# Patient Record
Sex: Male | Born: 1970 | Race: Black or African American | Hispanic: No | Marital: Single | Smoking: Never smoker
Health system: Southern US, Community
[De-identification: ages and names within clinical notes are randomized; demographics above are authoritative.]

## PROBLEM LIST (undated history)

## (undated) DIAGNOSIS — E119 Type 2 diabetes mellitus without complications: Secondary | ICD-10-CM

## (undated) DIAGNOSIS — K760 Fatty (change of) liver, not elsewhere classified: Secondary | ICD-10-CM

## (undated) DIAGNOSIS — I10 Essential (primary) hypertension: Secondary | ICD-10-CM

## (undated) DIAGNOSIS — M1990 Unspecified osteoarthritis, unspecified site: Secondary | ICD-10-CM

## (undated) HISTORY — DX: Type 2 diabetes mellitus without complications: E11.9

---

## 1998-05-29 ENCOUNTER — Emergency Department (HOSPITAL_COMMUNITY): Admission: EM | Admit: 1998-05-29 | Discharge: 1998-05-29 | Payer: Self-pay | Admitting: Emergency Medicine

## 1999-12-23 ENCOUNTER — Emergency Department (HOSPITAL_COMMUNITY): Admission: EM | Admit: 1999-12-23 | Discharge: 1999-12-23 | Payer: Self-pay | Admitting: *Deleted

## 2000-09-01 ENCOUNTER — Encounter: Payer: Self-pay | Admitting: Emergency Medicine

## 2000-09-01 ENCOUNTER — Emergency Department (HOSPITAL_COMMUNITY): Admission: EM | Admit: 2000-09-01 | Discharge: 2000-09-01 | Payer: Self-pay | Admitting: Emergency Medicine

## 2000-11-19 ENCOUNTER — Emergency Department (HOSPITAL_COMMUNITY): Admission: EM | Admit: 2000-11-19 | Discharge: 2000-11-19 | Payer: Self-pay | Admitting: Internal Medicine

## 2000-11-19 ENCOUNTER — Encounter: Payer: Self-pay | Admitting: Emergency Medicine

## 2001-04-02 ENCOUNTER — Emergency Department (HOSPITAL_COMMUNITY): Admission: EM | Admit: 2001-04-02 | Discharge: 2001-04-02 | Payer: Self-pay | Admitting: Emergency Medicine

## 2001-10-19 ENCOUNTER — Emergency Department (HOSPITAL_COMMUNITY): Admission: EM | Admit: 2001-10-19 | Discharge: 2001-10-19 | Payer: Self-pay

## 2001-10-23 ENCOUNTER — Emergency Department (HOSPITAL_COMMUNITY): Admission: EM | Admit: 2001-10-23 | Discharge: 2001-10-24 | Payer: Self-pay | Admitting: Emergency Medicine

## 2001-10-24 ENCOUNTER — Encounter: Payer: Self-pay | Admitting: Emergency Medicine

## 2001-12-12 ENCOUNTER — Emergency Department (HOSPITAL_COMMUNITY): Admission: EM | Admit: 2001-12-12 | Discharge: 2001-12-12 | Payer: Self-pay | Admitting: *Deleted

## 2001-12-12 ENCOUNTER — Encounter: Payer: Self-pay | Admitting: Internal Medicine

## 2001-12-12 ENCOUNTER — Ambulatory Visit (HOSPITAL_COMMUNITY): Admission: RE | Admit: 2001-12-12 | Discharge: 2001-12-12 | Payer: Self-pay | Admitting: Internal Medicine

## 2001-12-12 ENCOUNTER — Encounter: Admission: RE | Admit: 2001-12-12 | Discharge: 2001-12-12 | Payer: Self-pay | Admitting: Internal Medicine

## 2002-02-28 ENCOUNTER — Encounter: Admission: RE | Admit: 2002-02-28 | Discharge: 2002-02-28 | Payer: Self-pay | Admitting: Internal Medicine

## 2002-03-13 ENCOUNTER — Emergency Department (HOSPITAL_COMMUNITY): Admission: EM | Admit: 2002-03-13 | Discharge: 2002-03-14 | Payer: Self-pay | Admitting: Emergency Medicine

## 2002-03-14 ENCOUNTER — Encounter: Payer: Self-pay | Admitting: Emergency Medicine

## 2002-10-26 ENCOUNTER — Encounter: Payer: Self-pay | Admitting: Family Medicine

## 2002-10-26 ENCOUNTER — Encounter: Admission: RE | Admit: 2002-10-26 | Discharge: 2002-10-26 | Payer: Self-pay | Admitting: Family Medicine

## 2003-03-26 ENCOUNTER — Emergency Department (HOSPITAL_COMMUNITY): Admission: EM | Admit: 2003-03-26 | Discharge: 2003-03-26 | Payer: Self-pay | Admitting: Emergency Medicine

## 2003-05-14 ENCOUNTER — Emergency Department (HOSPITAL_COMMUNITY): Admission: EM | Admit: 2003-05-14 | Discharge: 2003-05-15 | Payer: Self-pay | Admitting: Emergency Medicine

## 2004-03-18 ENCOUNTER — Emergency Department (HOSPITAL_COMMUNITY): Admission: EM | Admit: 2004-03-18 | Discharge: 2004-03-18 | Payer: Self-pay | Admitting: Emergency Medicine

## 2004-05-01 ENCOUNTER — Ambulatory Visit (HOSPITAL_COMMUNITY): Admission: RE | Admit: 2004-05-01 | Discharge: 2004-05-01 | Payer: Self-pay | Admitting: Family Medicine

## 2004-05-01 ENCOUNTER — Encounter: Admission: RE | Admit: 2004-05-01 | Discharge: 2004-05-01 | Payer: Self-pay | Admitting: Family Medicine

## 2004-05-11 ENCOUNTER — Ambulatory Visit (HOSPITAL_BASED_OUTPATIENT_CLINIC_OR_DEPARTMENT_OTHER): Admission: RE | Admit: 2004-05-11 | Discharge: 2004-05-11 | Payer: Self-pay | Admitting: Sports Medicine

## 2004-05-12 ENCOUNTER — Encounter: Admission: RE | Admit: 2004-05-12 | Discharge: 2004-05-12 | Payer: Self-pay | Admitting: Family Medicine

## 2005-03-02 ENCOUNTER — Emergency Department (HOSPITAL_COMMUNITY): Admission: EM | Admit: 2005-03-02 | Discharge: 2005-03-02 | Payer: Self-pay | Admitting: Emergency Medicine

## 2005-04-14 ENCOUNTER — Emergency Department (HOSPITAL_COMMUNITY): Admission: EM | Admit: 2005-04-14 | Discharge: 2005-04-14 | Payer: Self-pay | Admitting: Emergency Medicine

## 2006-04-28 ENCOUNTER — Encounter: Admission: RE | Admit: 2006-04-28 | Discharge: 2006-04-28 | Payer: Self-pay | Admitting: Gastroenterology

## 2006-11-11 DIAGNOSIS — I10 Essential (primary) hypertension: Secondary | ICD-10-CM | POA: Insufficient documentation

## 2006-11-11 DIAGNOSIS — G4730 Sleep apnea, unspecified: Secondary | ICD-10-CM

## 2006-11-11 DIAGNOSIS — G4733 Obstructive sleep apnea (adult) (pediatric): Secondary | ICD-10-CM | POA: Insufficient documentation

## 2007-02-17 ENCOUNTER — Emergency Department (HOSPITAL_COMMUNITY): Admission: EM | Admit: 2007-02-17 | Discharge: 2007-02-17 | Payer: Self-pay | Admitting: Emergency Medicine

## 2007-04-28 ENCOUNTER — Emergency Department (HOSPITAL_COMMUNITY): Admission: EM | Admit: 2007-04-28 | Discharge: 2007-04-28 | Payer: Self-pay | Admitting: Emergency Medicine

## 2008-04-12 ENCOUNTER — Inpatient Hospital Stay (HOSPITAL_COMMUNITY): Admission: EM | Admit: 2008-04-12 | Discharge: 2008-04-14 | Payer: Self-pay | Admitting: Emergency Medicine

## 2008-04-12 ENCOUNTER — Ambulatory Visit: Payer: Self-pay | Admitting: Internal Medicine

## 2008-04-12 ENCOUNTER — Ambulatory Visit: Payer: Self-pay | Admitting: Cardiovascular Disease

## 2008-04-13 ENCOUNTER — Encounter (INDEPENDENT_AMBULATORY_CARE_PROVIDER_SITE_OTHER): Payer: Self-pay | Admitting: Internal Medicine

## 2008-04-13 ENCOUNTER — Ambulatory Visit: Payer: Self-pay | Admitting: Vascular Surgery

## 2008-12-17 ENCOUNTER — Emergency Department (HOSPITAL_COMMUNITY): Admission: EM | Admit: 2008-12-17 | Discharge: 2008-12-17 | Payer: Self-pay | Admitting: Emergency Medicine

## 2010-10-05 ENCOUNTER — Encounter: Payer: Self-pay | Admitting: Emergency Medicine

## 2010-10-05 ENCOUNTER — Encounter: Payer: Self-pay | Admitting: Family Medicine

## 2010-10-05 ENCOUNTER — Encounter: Payer: Self-pay | Admitting: Gastroenterology

## 2011-01-30 NOTE — Discharge Summary (Signed)
NAME:  DESI, ROWE NO.:  [id number]   MEDICAL RECORD NO.:  [id number]          PATIENT TYPE:  INP   LOCATION:  4710                         FACILITY:  [facility]   PHYSICIAN:  Ileana Roup, M.D.  DATE OF BIRTH:  October 21, 1970   DATE OF ADMISSION:  04/12/2008  DATE OF DISCHARGE:  04/14/2008                               DISCHARGE SUMMARY   DISCHARGE DIAGNOSES:  1. Chest pain, etiology unclear  2. Abdominal pain.  3. Alcohol abuse.  4. Leukocytosis.  5. Hypertension.  6. Obstructive sleep apnea.  7. Increased LFTs.   DISCHARGE MEDICATIONS:  1. Amlodipine 5 mg p.o. daily.  2. Benazepril-HCTZ 20-25 mg p.o. daily.  3. Protonix 40 mg p.o. daily.   DISPOSITION AND FOLLOWUP:  The patient is to call [facility] to schedule followup appointment on Monday after discharge.  At  this time, the patient's abdominal/chest pain should be reassessed.  The  patient's blood pressure should also be noted since his medications were  started at the time of discharge.  The patient should cut back on his  alcohol, which could be contributing to his abdominal pain.   PROCEDURES PERFORMED:  1. The patient had a chest x-ray on April 12, 2008.  Impression:  No      acute process.  2. The patient had a CT of the abdomen and pelvis done on April 13, 2008.  CT of the abdomen, impression:  Suspect small gallstones.      Gallbladder slightly distended.  If there is high clinical      suspicion for significant gallbladder disease, ultrasound may be      helpful.  Diffuse fatty infiltration of the liver.  A 12-mm      nonspecific left adrenal nodule; in the absence of cancer history,      this statistically is most likely a small adenoma.  Left basilar      atelectasis.  Scattered left-sided colonic diverticula.  3. CT of pelvis, impression:  No acute findings in the pelvis.  4. The patient also had an abdominal ultrasound done on April 13, 2008.      Impression:  a.     Limited visualization of some structures due to body       habitus.      b.     Fatty, enlarged liver.  Splenomegaly.  5. The patient had a 2-D echo done on April 13, 2008.  Summary, overall      left ventricular systolic function was normal.  Left ventricle      ejection fraction was estimated to be 60%.  There was no left      ventricle regional wall motion abnormalities.  Left ventricular      wall thickness was mildly increased.   There were no consultations made.   Brief H&P is as follows:   CHIEF COMPLAINT:  Chest pain, abdominal pain, nausea, and vomiting.   PRIMARY CARE PHYSICIAN:  [facility].   HISTORY OF PRESENT ILLNESS:  The patient is a 40 year old male with  past  medical history of obstructive sleep apnea, no CPAP, hypertension,  presenting with 3 days of chest pain.  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  This caused him to throw up x3, after  which he felt tired and fatigued.  This occurred in the evening and  after sitting for a while the pain improved, but remained present.  The  pain is sharp, better with rest, and worse with exertion.  Over the last  3 days, he has had postprandial emesis x2-3 with every meal.  He also  has had subjective fever and chills, no fever while here, had a high  temperature per his clinic.  The pain is worse with eating, but vomiting  does not help.  He has had shortness of breath with exertion, sleep  apnea, orthopnea, and a chronic cough for years.  He denies diarrhea,  muscle aches, melena, hematochezia, difficulty swallowing, and sick  contacts.   For allergies, past medical history, past surgical history, home  medications, substance history, social history, and family history,  please see hospital chart.   REVIEW OF SYSTEMS:  As seen in HPI.   PHYSICAL EXAMINATION:  VITAL SIGNS:  Temperature equals to 97.5;  blood  pressure 87/45, after morphine which quickly normalized; pulse 107;  respiratory rate 18; and O2 sat is 91% on room air.  GENERAL:  Obese, but muscular in no acute distress.  Eyes, anicteric and  PERLA.  ENT, good dentition.  NECK:  Obese.  No JVD.  No carotid bruits.  No thyromegaly.  RESPIRATORY:  CTA bilaterally.  Good air movement.  CV:  S1 and S2 distant, no murmurs, mildly tachycardic.  GI:  Obese and nontender.  No murmurs, rubs, or gallops, and unable to  appreciate any hepatomegaly.  EXTREMITIES:  Large calf muscles with anterior right shin tenderness and  loss of hair bilaterally in the lower legs at the shins.  GU:  No CVA tenderness.  SKIN:  Small keloid scars with multiple moles on his neck.  MUSCULOSKELETAL:  Normal.  NEURO:  Alert and orient x3, nonfocal with good strength in all 4  extremities.  Some numbness in the right and left feet, chronic.  PSYCH:  Alert, pleasant, normal affect.   INITIAL LABORATORY DATA:  Sodium 134, potassium 3.8, chloride 100,  bicarb 25, anion gap of 9, BUN 24, creatinine 1.23, glucose 149,  bilirubin 1.2, AST 51, ALT 58, protein 7.3. albumin 3.7, and calcium  9.2.  WBC 11.5, hemoglobin 16, platelets 177, ANC 8.5, and MCV 98.8.  Chest x-ray was negative.  Urinalysis was negative.  The patient was  FOBT negative.  EKG showed question of left posterior hemiblock.  PTT  26, PT 14.1, and INR 1.1.  BNP less than 30.  D-dimer less than 0.22.  UDS was positive for opiates which he received morphine in the ED prior  to the test.  EtOH less than 5.  Cardiac enzymes, CK-MB 6.5 and troponin  less than 0.05.   HOSPITAL COURSE:  1. Chest pain, etiology unclear, but ACS unlikely given negative      cardiac enzymes and EKG and the nature of the symptoms.  The      patient's pain is likely GI in nature, possibly secondary to      biliary colic versus gastroenteritis versus PUD versus GERD.  As      mentioned above, the patient  was ruled out for  ACS.  He also      received CT of the abdomen and pelvis with above findings as well      as an abdominal ultrasound.  His pain had resolved by the time of      discharge.  He will be discharged on Protonix 40 daily and has been      told to avoid NSAIDS and to cut back on his alcohol.  The patient      was also ruled out for PE and pneumonia with chest x-ray and a      negative D-dimer.  2. Abdominal pain.  Please see above.  The patient's abdominal      ultrasound did show diffuse fatty infiltration and splenomegaly,      likely secondary to the patient's chronic alcohol abuse.  There is      no evidence of gallbladder pathology on the abdominal ultrasound.      The patient will be discharged on PPI, and I have advised him to      cut back on his alcohol and eventually quit due to progression of      liver disease.  The patient expressed understanding and said he      would cut back.  3. Alcohol abuse.  Please see #2 above.  4. Leukocytosis.  The patient's mildly increased WBC had resolved at      the time of discharge.  5. Hypertension.  Initially, the patient was put on metoprolol alone      secondary to the initial thought of ACS.  His home medications of      benazepril HCTZ and Norvasc were initially held secondary to the      patient's blood pressure dropping in the mid 80s after he received      morphine.  These were resumed prior to discharge and the patient's      metoprolol was discontinued as the ACS was very unlikely in this      case.  The patient's ACE inhibitor was also initially held      secondary to the patient's creatinine increasing, which quickly      normalized and as a result this was restarted.  6. Obstructive sleep apnea.  The patient has a history of severe      obstructive sleep apnea in the past, but is not on CPAP at home.      We have arranged for case manager to help arrange for home CPAP and      as a result he will need sleep study, so CPAP titration  can be      undertaken.  This will be done in the outpatient setting.  7. Increased LFTs.  The patient had a negative hepatitis panel and      abdominal findings showed diffuse fatty liver.  This is likely      secondary to the patient's long history of alcohol abuse.  I have      asked him to cut back on his alcohol and eventually quit as      mentioned above.   SIGNIFICANT LABORATORY DATA DURING HOSPITAL STAY:  Include PT 14.1, INR  1.1, and PTT 26.  D-dimer less than 0.22.  BNP less than 30.  Lipase 22.  TSH 1.518.  Hemoglobin A1c 6.1, cholesterol 153, triglycerides 74, HDL  28, and LDL 110.  Magnesium 1.8.  Hepatitis B surface antigen negative,  hepatitis B core antibody negative, hepatitis A antibody  negative,  hepatitis C antibody negative.   DISCHARGE LABORATORY DATA:  Sodium 133, potassium 3.6, chloride 94,  bicarb 27, glucose 108, BUN 9, creatinine 1.0, calcium 9.2, total  bilirubin 1.7, direct bilirubin 0.3, indirect bilirubin 1.4, alkaline  phosphatase 54, AST 41, ALT 48, total protein 7.1, and albumin 3.5.  WBC  8.3, hemoglobin 15.6, hematocrit 44.0, MCV 96.7, and platelets 158.   DISCHARGE VITAL SIGNS:  Temperature 97.5, pulse 88, respiratory rate 20,  blood pressure 155/109, and O2 sats 98% on room air.      Rufina Falco, M.D.  Electronically Signed      Ileana Roup, M.D.  Electronically Signed    JY/MEDQ  D:  04/20/2008  T:  04/20/2008  Job:  [id number]   cc:   Olena Leatherwood [facility]

## 2011-01-30 NOTE — Procedures (Signed)
NAME:  Ernest Myers, Ernest Myers NO.:  [id number]   MEDICAL RECORD NO.:  [id number]          PATIENT TYPE:  OUT   LOCATION:  SLEEP CENTER                 FACILITY:  [facility]   PHYSICIAN:  Clinton D. Maple Hudson, M.D. DATE OF BIRTH:  08-04-1971   DATE OF ADMISSION:  05/11/2004  DATE OF DISCHARGE:  05/11/2004                              NOCTURNAL POLYSOMNOGRAM   REFERRING PHYSICIAN:  Royal Hawthorn B. Darrick Penna, M.D.   INDICATION FOR STUDY AND HISTORY:  Hypersomnia with sleep apnea.   Epworth sleepiness score 18/24, neck size 18.5 inches, BMI 49, weight not  specified.   MEDICATIONS:  None listed.   SLEEP ARCHITECTURE:  Total sleep time 383 minutes with sleep efficiency of  81%.  Stage I was 11%; stage II, 57%; Stages III and IV 12%.  REM was 20% of  total sleep time.  Latency to sleep onset 30 minutes, latency to REM 122  minutes.  Awake after sleep onset 59 minute.  Arousal index 59 per hour.   RESPIRATORY DATA:  Split study protocol.  RDI 119 per hour before CPAP  consistent with very severe obstructive sleep apnea/hypopnea syndrome.  This  included 229 obstructive apneas and 46 hypopneas before CPAP.  He slept  mostly on his back.  REM RDI was 54 per hour.  CPAP was titrated to 17 CWP.  Best control was achieved at 15 CWP, RDI 7 per hour.  A large Respironics  soft gel face mask was used with heated humidifier.   OXYGEN DATA:  Very loud snoring before CPAP with moderate oxygen  desaturation to a nadir of 71% .  Mean saturation was 92 to 94% through the  study with normal oxygenation on CPAP.   CARDIAC DATA:  Normal sinus rhythm with occasional PVCs.   MOVEMENT AND PARASOMNIA:  Occasional leg jerks with insignificant effect on  sleep.   IMPRESSION AND RECOMMENDATIONS:  Very severe obstructive sleep  apnea/hypopnea syndrome, RDI 119 per hour.  Fairly good control on CPAP at  15 CWP, RDI 7 per hour, using a large Respironics soft gel mask with heated  humidifier.   Clinton D. Maple Hudson,  M.D.  Diplomate, Biomedical engineer of Sleep Medicine                                   ______________________________                                Rennis Chris. Maple Hudson, M.D.                                Diplomate, American Board of Sleep Medicine    CDY/MEDQ  D:  05/18/2004 12:37:01  T:  05/19/2004 15:15:16  Job:  [id number]

## 2011-03-22 ENCOUNTER — Emergency Department (HOSPITAL_COMMUNITY)
Admission: EM | Admit: 2011-03-22 | Discharge: 2011-03-22 | Disposition: A | Discharge status: Home / Self Care | Payer: PRIVATE HEALTH INSURANCE | Attending: Emergency Medicine | Admitting: Emergency Medicine

## 2011-03-22 DIAGNOSIS — Y998 Other external cause status: Secondary | ICD-10-CM | POA: Insufficient documentation

## 2011-03-22 DIAGNOSIS — Y92009 Unspecified place in unspecified non-institutional (private) residence as the place of occurrence of the external cause: Secondary | ICD-10-CM | POA: Insufficient documentation

## 2011-03-22 DIAGNOSIS — IMO0002 Reserved for concepts with insufficient information to code with codable children: Secondary | ICD-10-CM | POA: Insufficient documentation

## 2011-03-22 DIAGNOSIS — X58XXXA Exposure to other specified factors, initial encounter: Secondary | ICD-10-CM | POA: Insufficient documentation

## 2011-03-22 DIAGNOSIS — I10 Essential (primary) hypertension: Secondary | ICD-10-CM | POA: Insufficient documentation

## 2011-06-12 LAB — COMPREHENSIVE METABOLIC PANEL
ALT: 56 — ABNORMAL HIGH
AST: 53 — ABNORMAL HIGH
Albumin: 3.7
Alkaline Phosphatase: 55
BUN: 24 — ABNORMAL HIGH
CO2: 25
CO2: 28
Chloride: 93 — ABNORMAL LOW
Creatinine, Ser: 1.23
GFR calc Af Amer: >60
GFR calc non Af Amer: >60
Glucose, Bld: 149 — ABNORMAL HIGH
Potassium: 3.8
Potassium: 3.8
Sodium: 132 — ABNORMAL LOW
Sodium: 134 — ABNORMAL LOW
Total Bilirubin: 2 — ABNORMAL HIGH
Total Protein: 7.3

## 2011-06-12 LAB — BASIC METABOLIC PANEL
BUN: 9
CO2: 27
Calcium: 9.2
Creatinine, Ser: 1
GFR calc Af Amer: >60

## 2011-06-12 LAB — CARDIAC PANEL(CRET KIN+CKTOT+MB+TROPI)
CK, MB: 6.2 — ABNORMAL HIGH
CK, MB: 7.2 — ABNORMAL HIGH
Total CK: 513 — ABNORMAL HIGH
Total CK: 611 — ABNORMAL HIGH
Troponin I: 0.01
Troponin I: <0.01

## 2011-06-12 LAB — LIPID PANEL
HDL: 28 — ABNORMAL LOW
VLDL: 15

## 2011-06-12 LAB — DIFFERENTIAL
Basophils Absolute: 0.1
Basophils Absolute: 0.1
Basophils Absolute: 0.1
Basophils Relative: 1
Basophils Relative: 1
Eosinophils Absolute: 0.1
Eosinophils Absolute: 0.2
Eosinophils Absolute: 0.2
Eosinophils Relative: 2
Lymphs Abs: 2.9
Monocytes Relative: 10
Neutro Abs: 8.5 — ABNORMAL HIGH
Neutro Abs: 5
Neutrophils Relative %: 60

## 2011-06-12 LAB — CBC
Hemoglobin: 16.1
MCHC: 33.8
MCHC: 35.4
MCV: 98.8
Platelets: 177
Platelets: 158
RBC: 4.8
RBC: 4.55
RDW: 12.9
WBC: 11.4 — ABNORMAL HIGH
WBC: 11.5 — ABNORMAL HIGH

## 2011-06-12 LAB — URINALYSIS, ROUTINE W REFLEX MICROSCOPIC
Bilirubin Urine: NEGATIVE
Glucose, UA: NEGATIVE
Hgb urine dipstick: NEGATIVE
Ketones, ur: NEGATIVE

## 2011-06-12 LAB — HEPATITIS PANEL, ACUTE
HCV Ab: NEGATIVE
Hepatitis B Surface Ag: NEGATIVE

## 2011-06-12 LAB — HEPATIC FUNCTION PANEL
AST: 41 — ABNORMAL HIGH
Albumin: 3.5
Total Protein: 7.1

## 2011-06-12 LAB — POCT CARDIAC MARKERS: Troponin i, poc: <0.05

## 2011-06-12 LAB — RAPID URINE DRUG SCREEN, HOSP PERFORMED
Amphetamines: NOT DETECTED
Barbiturates: NOT DETECTED
Opiates: POSITIVE — AB
Tetrahydrocannabinol: NOT DETECTED

## 2011-06-12 LAB — PROTIME-INR: Prothrombin Time: 14.1

## 2011-06-12 LAB — MAGNESIUM: Magnesium: 1.8

## 2011-06-12 LAB — APTT: aPTT: 26

## 2011-06-12 LAB — HEMOGLOBIN A1C: Mean Plasma Glucose: 140

## 2011-06-12 LAB — ETHANOL: Alcohol, Ethyl (B): <5

## 2011-07-02 LAB — CBC
HCT: 47.3
Hemoglobin: 16.3
MCHC: 34.5
MCV: 95.8
Platelets: 192
RBC: 4.93
RDW: 13.5
WBC: 10.2

## 2011-07-02 LAB — COMPREHENSIVE METABOLIC PANEL
ALT: 91 — ABNORMAL HIGH
AST: 65 — ABNORMAL HIGH
CO2: 24
Calcium: 10.1
Chloride: 103
Creatinine, Ser: 1.14
GFR calc non Af Amer: >60
Glucose, Bld: 119 — ABNORMAL HIGH
Total Bilirubin: 1.2

## 2011-07-02 LAB — DIFFERENTIAL
Basophils Absolute: 0.1
Basophils Relative: 1
Eosinophils Absolute: 0.2
Eosinophils Relative: 2
Lymphocytes Relative: 19
Lymphs Abs: 1.9
Monocytes Absolute: 0.9 — ABNORMAL HIGH
Monocytes Relative: 9
Neutro Abs: 7.1
Neutrophils Relative %: 70

## 2011-07-02 LAB — PROTIME-INR
INR: 1
Prothrombin Time: 13.7

## 2011-07-02 LAB — APTT: aPTT: 29

## 2012-07-04 ENCOUNTER — Ambulatory Visit
Admission: RE | Admit: 2012-07-04 | Discharge: 2012-07-04 | Disposition: A | Discharge status: Home / Self Care | Payer: PRIVATE HEALTH INSURANCE | Source: Ambulatory Visit | Attending: Family Medicine | Admitting: Family Medicine

## 2012-07-04 ENCOUNTER — Other Ambulatory Visit: Payer: Self-pay | Admitting: Family Medicine

## 2012-07-04 DIAGNOSIS — R52 Pain, unspecified: Secondary | ICD-10-CM

## 2013-01-30 ENCOUNTER — Ambulatory Visit (INDEPENDENT_AMBULATORY_CARE_PROVIDER_SITE_OTHER): Payer: PRIVATE HEALTH INSURANCE | Admitting: Physician Assistant

## 2013-01-30 ENCOUNTER — Encounter: Payer: Self-pay | Admitting: Physician Assistant

## 2013-01-30 VITALS — BP 146/86 | HR 80 | Temp 98.0°F | Resp 18 | Ht 69.5 in | Wt 361.0 lb

## 2013-01-30 DIAGNOSIS — M222X1 Patellofemoral disorders, right knee: Secondary | ICD-10-CM

## 2013-01-30 DIAGNOSIS — E669 Obesity, unspecified: Secondary | ICD-10-CM

## 2013-01-30 DIAGNOSIS — M25569 Pain in unspecified knee: Secondary | ICD-10-CM

## 2013-01-30 MED ORDER — NAPROXEN 500 MG PO TABS: 500.0000 mg | ORAL_TABLET | Freq: Two times a day (BID) | ORAL | Status: DC

## 2013-01-30 NOTE — Progress Notes (Signed)
   Patient ID: Ernest Myers MRN: [id number], DOB: 04-Mar-1971, 42 y.o. Date of Encounter: 01/30/2013, 8:45 AM    Chief Complaint:  Chief Complaint  Patient presents with  . Knee Pain    right       HPI: 42 y.o. year old male has c/o right knee pain. Says he has no prior h/o knee surgery or any evaluation of right knee pain. He reports that right knee has hurt every day for past two years. Hurts all the time. Says he "walks with a limp'. He works on a [employer]. 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 Never goes up and down steps to know how this affects his knee. States that it never locks up or gives way. States that hte pain is at the front of his knee/ anterior aspect.  He had no trauma or injury at time of symptom onset.   Home Meds: See attached medication section for any medications that were entered at today's visit. The computer does not put those onto this list.The following list is a list of meds entered prior to today's visit.   No current outpatient prescriptions on file prior to visit.   No current facility-administered medications on file prior to visit.    Allergies: No Known Allergies    Review of Systems: See HPI for pertinent ROS. All other ROS negative.    Physical Exam: Blood pressure 146/86, pulse 80, temperature 98 F (36.7 C), temperature source Oral, resp. rate 18, height 5' 9.5" (1.765 m), weight 361 lb (163.749 kg)., Body mass index is 52.56 kg/(m^2). General: Severely obese AAM. Appears in no acute distress. Lungs: Clear bilaterally to auscultation without wheezes, rales, or rhonchi. Breathing is unlabored. Heart: Regular rhythm. No murmurs, rubs, or gallops. Msk:  Right Knee: Inspection is normal except for obesity. No effusion, swelling, ecchymosis. Palpation reveals tenderness with palpation of anterior knee at patella. Less tenderness at medial joint line. Negative grind test. Negative anterior drawer. No  laxity with valgus/varus.  Skin: Warm and dry. Neuro: Alert and oriented X 3. Moves all extremities spontaneously. Gait is normal. CNII-XII grossly in tact. Psych:  Responds to questions appropriately with a normal affect.     ASSESSMENT AND PLAN:  42 y.o. year old male with  1. Patellofemoral syndrome, right - naproxen (NAPROSYN) 500 MG tablet; Take 1 tablet (500 mg total) by mouth 2 (two) times daily with a meal.  Dispense: 60 tablet; Refill: 0  2. OBESITY, NOS 3. Osteo Arthritis of knee.  Discussed with him that a lot of this is probably secondary to his weight. All of his weight is bearing down on these two little knee joints. We have discussed diet, exercise, weight loss multiple times in past. Discussed it again today.  I demonstrated exercises for him to do to strengthen quadriceps and muscles that support the knee. He is to do these at least BID on routine basis.  He is to take Naprosyn with food. If no improvement, f/u and will get XRay.    Signed, [address] [place], [place], [facility] 01/30/2013 8:45 AM

## 2013-02-06 ENCOUNTER — Other Ambulatory Visit: Payer: Self-pay | Admitting: Family Medicine

## 2013-02-25 ENCOUNTER — Other Ambulatory Visit: Payer: Self-pay | Admitting: Family Medicine

## 2013-03-20 ENCOUNTER — Ambulatory Visit (INDEPENDENT_AMBULATORY_CARE_PROVIDER_SITE_OTHER): Payer: PRIVATE HEALTH INSURANCE | Admitting: Physician Assistant

## 2013-03-20 ENCOUNTER — Encounter: Payer: Self-pay | Admitting: Physician Assistant

## 2013-03-20 VITALS — BP 154/100 | HR 84 | Temp 98.4°F | Resp 20 | Ht 69.0 in | Wt 361.0 lb

## 2013-03-20 DIAGNOSIS — E669 Obesity, unspecified: Secondary | ICD-10-CM

## 2013-03-20 DIAGNOSIS — M25569 Pain in unspecified knee: Secondary | ICD-10-CM

## 2013-03-20 DIAGNOSIS — M79671 Pain in right foot: Secondary | ICD-10-CM

## 2013-03-20 DIAGNOSIS — M222X1 Patellofemoral disorders, right knee: Secondary | ICD-10-CM

## 2013-03-20 DIAGNOSIS — M79609 Pain in unspecified limb: Secondary | ICD-10-CM

## 2013-03-20 NOTE — Progress Notes (Signed)
   Patient ID: Ernest Myers MRN: [id number], DOB: [birth year], 42 y.o. Date of Encounter: 03/20/2013, 12:52 PM    Chief Complaint:  Chief Complaint  Patient presents with  . c/o right knee and foot pain     HPI: 42 y.o. year old obese AA male is here with c/o pain in right knee and right heel/foot.   Says the pain in the right knee is the same pain that he c/o at [facility] with me 01/30/13. See that note. Says he sill has constant pain in anterior portion of the knee. Hurts worse if he stands up after sitting prlonged time. He never did the exercises I recommended at the LOV. Still, the knee never locks up or gives way.  Now he also c/o pain in the heel of his right foot. No injury/trauma.      Home Meds: See attached medication section for any medications that were entered at today's visit. The computer does not put those onto this list.The following list is a list of meds entered prior to today's visit.   Current Outpatient Prescriptions on File Prior to Visit  Medication Sig Dispense Refill  . amLODipine (NORVASC) 5 MG tablet TAKE 1 TABLET EVERY DAY  30 tablet  5  . benazepril (LOTENSIN) 20 MG tablet TAKE 1 TABLET EVERY DAY  30 tablet  5  . naproxen (NAPROSYN) 500 MG tablet Take 1 tablet (500 mg total) by mouth 2 (two) times daily with a meal.  60 tablet  0   No current facility-administered medications on file prior to visit.    Allergies: No Known Allergies    Review of Systems: See HPI for pertinent ROS. All other ROS negative.    Physical Exam: Blood pressure 154/100, pulse 84, temperature 98.4 F (36.9 C), temperature source Oral, resp. rate 20, height 5\' 9"  (1.753 m), weight 361 lb (163.749 kg)., Body mass index is 53.29 kg/(m^2). General: Morbidly obese AAM. Appears in no acute distress. Lungs: Clear bilaterally to auscultation without wheezes, rales, or rhonchi. Breathing is unlabored. Heart: Regular rhythm. No murmurs, rubs, or gallops. Msk:  Strength and tone normal  for age. Right Knee; inspection is normal except for obesity. No effusion, swelling. Palpation reveal tenderness at anterior aspect of patella. No tenderness with palpation of joint line. Negative grind test. No laxity with valgus or varus.   Right foot: there is mild pain with palpation of bottom the heel. No pain with palpation of posterior aspect of heel. No pain with palpation along medial aspect of arch of foot.  Neuro: Alert and oriented X 3. Moves all extremities spontaneously. Gait is normal. CNII-XII grossly in tact. Psych:  Responds to questions appropriately with a normal affect.     ASSESSMENT AND PLAN:  42 y.o. year old male with  1. Patellofemoral syndrome, right Told him to do exercise BID- leg extensions and wall squats.   2. Heel pain, right Will get XRay to r/o fracture.  - DG Foot Complete Right; Future  3. OBESITY, NOS Discussed this multiple times and discussed at LOV regarding affect on knees.   [address] [place], [place], [facility] 03/20/2013 12:52 PM

## 2013-04-11 ENCOUNTER — Emergency Department (HOSPITAL_COMMUNITY)
Admission: EM | Admit: 2013-04-11 | Discharge: 2013-04-11 | Disposition: A | Discharge status: Home / Self Care | Payer: BC Managed Care – PPO | Attending: Emergency Medicine | Admitting: Emergency Medicine

## 2013-04-11 ENCOUNTER — Encounter (HOSPITAL_COMMUNITY): Payer: Self-pay

## 2013-04-11 DIAGNOSIS — R109 Unspecified abdominal pain: Secondary | ICD-10-CM | POA: Insufficient documentation

## 2013-04-11 DIAGNOSIS — Z79899 Other long term (current) drug therapy: Secondary | ICD-10-CM | POA: Insufficient documentation

## 2013-04-11 DIAGNOSIS — K769 Liver disease, unspecified: Secondary | ICD-10-CM | POA: Insufficient documentation

## 2013-04-11 DIAGNOSIS — M1990 Unspecified osteoarthritis, unspecified site: Secondary | ICD-10-CM | POA: Insufficient documentation

## 2013-04-11 DIAGNOSIS — I10 Essential (primary) hypertension: Secondary | ICD-10-CM | POA: Insufficient documentation

## 2013-04-11 HISTORY — DX: Unspecified osteoarthritis, unspecified site: M19.90

## 2013-04-11 HISTORY — DX: Fatty (change of) liver, not elsewhere classified: K76.0

## 2013-04-11 HISTORY — DX: Essential (primary) hypertension: I10

## 2013-04-11 LAB — CBC
HCT: 44.7 % (ref 39.0–52.0)
Hemoglobin: 15.6 g/dL (ref 13.0–17.0)
MCH: 33.8 pg (ref 26.0–34.0)
MCHC: 34.9 g/dL (ref 30.0–36.0)
MCV: 97 fL (ref 78.0–100.0)
RDW: 13.3 % (ref 11.5–15.5)

## 2013-04-11 LAB — COMPREHENSIVE METABOLIC PANEL
Albumin: 3.5 g/dL (ref 3.5–5.2)
Alkaline Phosphatase: 50 U/L (ref 39–117)
BUN: 10 mg/dL (ref 6–23)
Creatinine, Ser: 0.7 mg/dL (ref 0.50–1.35)
GFR calc Af Amer: >90 mL/min (ref >90)
Glucose, Bld: 128 mg/dL — ABNORMAL HIGH (ref 70–99)
Potassium: 3.6 mEq/L (ref 3.5–5.1)
Total Protein: 7.5 g/dL (ref 6.0–8.3)

## 2013-04-11 LAB — LIPASE, BLOOD: Lipase: 27 U/L (ref 11–59)

## 2013-04-11 LAB — TROPONIN I: Troponin I: <0.3 ng/mL (ref <0.30)

## 2013-04-11 MED ORDER — HYDROCODONE-ACETAMINOPHEN 5-325 MG PO TABS: 1.0000 | ORAL_TABLET | Freq: Four times a day (QID) | ORAL | Status: DC | PRN

## 2013-04-11 MED ORDER — FAMOTIDINE 20 MG PO TABS
20.0000 mg | ORAL_TABLET | Freq: Once | ORAL | Status: AC
  Administered 2013-04-11: 20 mg via ORAL
  Filled 2013-04-11: qty 1

## 2013-04-11 MED ORDER — PANTOPRAZOLE SODIUM 40 MG PO TBEC: 40.0000 mg | DELAYED_RELEASE_TABLET | Freq: Every day | ORAL | Status: DC

## 2013-04-11 MED ORDER — GI COCKTAIL ~~LOC~~
30.0000 mL | Freq: Once | ORAL | Status: AC
  Administered 2013-04-11: 30 mL via ORAL
  Filled 2013-04-11: qty 30

## 2013-04-11 NOTE — ED Provider Notes (Addendum)
CSN: [id number]     Arrival date & time 04/11/13  4098 History     First MD Initiated Contact with Patient 04/11/13 0755     Chief Complaint  Patient presents with  . Abdominal Pain   (Consider location/radiation/quality/duration/timing/severity/associated sxs/prior Treatment) Patient is a 42 y.o. male presenting with abdominal pain. The history is provided by the patient.  Abdominal Pain Associated symptoms include abdominal pain. Pertinent negatives include no chest pain, no headaches and no shortness of breath.  pt c/o epigastric and luq pain for the past several weeks. Gradual onset. Constant. Dull. Mild-mod. Denies specific exacerbating or alleviating factors. Is present at rest. No relation to exertion or activity. Not related to certain foods. Normal appetite. No nvd. Having normal bms, no constipation. Denies hx pud, pancreatitis or gallstones. No prior abd surgery. Pain does not radiate. No back or flank pain. No gu c/o. No cp or sob. No fever or chills. No acute or abrupt worsening of or change in pain today.    Past Medical History  Diagnosis Date  . Hypertension   . Fatty liver   . Arthritis    History reviewed. No pertinent past surgical history. No family history on file. History  Substance Use Topics  . Smoking status: Never Smoker   . Smokeless tobacco: Never Used  . Alcohol Use: Yes     Comment: social    Review of Systems  Constitutional: Negative for fever.  HENT: Negative for neck pain.   Eyes: Negative for redness.  Respiratory: Negative for shortness of breath.   Cardiovascular: Negative for chest pain.  Gastrointestinal: Positive for abdominal pain. Negative for vomiting, diarrhea and constipation.  Genitourinary: Negative for flank pain.  Musculoskeletal: Negative for back pain.  Skin: Negative for rash.  Neurological: Negative for headaches.  Hematological: Does not bruise/bleed easily.  Psychiatric/Behavioral: Negative for confusion.     Allergies  Review of patient's allergies indicates no known allergies.  Home Medications   Current Outpatient Rx  Name  Route  Sig  Dispense  Refill  . amLODipine (NORVASC) 5 MG tablet   Oral   Take 5 mg by mouth daily.         . benazepril (LOTENSIN) 20 MG tablet   Oral   Take 20 mg by mouth daily.          BP 153/94  Pulse 102  Temp(Src) 97.9 F (36.6 C) (Oral)  Resp 20  SpO2 94% Physical Exam  Nursing note and vitals reviewed. Constitutional: He is oriented to person, place, and time. He appears well-developed and well-nourished. No distress.  HENT:  Head: Atraumatic.  Mouth/Throat: Oropharynx is clear and moist.  Eyes: Conjunctivae are normal. No scleral icterus.  Neck: Neck supple. No tracheal deviation present.  Cardiovascular: Normal rate, regular rhythm, normal heart sounds and intact distal pulses.  Exam reveals no gallop and no friction rub.   No murmur heard. Pulmonary/Chest: Effort normal and breath sounds normal. No accessory muscle usage. No respiratory distress.  Abdominal: Soft. Bowel sounds are normal. He exhibits no distension and no mass. There is no tenderness. There is no rebound and no guarding.  No hernia. Obese.   Genitourinary:  No cva tenderness  Musculoskeletal: Normal range of motion. He exhibits no edema and no tenderness.  Neurological: He is alert and oriented to person, place, and time.  Skin: Skin is warm and dry. No rash noted.  No skin lesions/rash/shingles in area of pain  Psychiatric: He has a normal mood  and affect.    ED Course   Procedures (including critical care time)  Results for orders placed during the hospital encounter of 04/11/13  CBC      Result Value Range   WBC 7.7  4.0 - 10.5 K/uL   RBC 4.61  4.22 - 5.81 MIL/uL   Hemoglobin 15.6  13.0 - 17.0 g/dL   HCT 03.4  74.2 - 59.5 %   MCV 97.0  78.0 - 100.0 fL   MCH 33.8  26.0 - 34.0 pg   MCHC 34.9  30.0 - 36.0 g/dL   RDW 63.8  75.6 - 43.3 %   Platelets 155   150 - 400 K/uL  COMPREHENSIVE METABOLIC PANEL      Result Value Range   Sodium 139  135 - 145 mEq/L   Potassium 3.6  3.5 - 5.1 mEq/L   Chloride 103  96 - 112 mEq/L   CO2 25  19 - 32 mEq/L   Glucose, Bld 128 (*) 70 - 99 mg/dL   BUN 10  6 - 23 mg/dL   Creatinine, Ser 2.95  0.50 - 1.35 mg/dL   Calcium 9.0  8.4 - 18.8 mg/dL   Total Protein 7.5  6.0 - 8.3 g/dL   Albumin 3.5  3.5 - 5.2 g/dL   AST 48 (*) 0 - 37 U/L   ALT 56 (*) 0 - 53 U/L   Alkaline Phosphatase 50  39 - 117 U/L   Total Bilirubin 0.2 (*) 0.3 - 1.2 mg/dL   GFR calc non Af Amer >90  >90 mL/min   GFR calc Af Amer >90  >90 mL/min  LIPASE, BLOOD      Result Value Range   Lipase 27  11 - 59 U/L  TROPONIN I      Result Value Range   Troponin I <0.30  <0.30 ng/mL       MDM  pepcid and gi cocktail po.  Labs.  Reviewed nursing notes and prior charts for additional history.    Date: 04/11/2013  Rate: 93  Rhythm: normal sinus rhythm  QRS Axis: normal  Intervals: normal  ST/T Wave abnormalities: nonspecific ST/T changes  Conduction Disutrbances:none  Narrative Interpretation:   Old EKG Reviewed: unchanged  After symptoms, present, constant for days, trop neg. No cp. Wbc normal. Lipase normal, Afeb. abd soft non tender.  Discussed diff dx incl gastritis, biliary colic, non spec abd pain.  No nvd, afeb, no abd tenderness, pt comfortable - appears stable for d/c. Discussed close pcp follow up - pt states pcp is [facility], will follow up there.      Suzi Roots, MD 04/11/13 1031

## 2013-04-11 NOTE — ED Notes (Signed)
Pt c/o LUQ swelling/pain x3wks, worse after eating and when laying down, denies SOB/nausea/diaphoresis

## 2014-01-25 ENCOUNTER — Other Ambulatory Visit (HOSPITAL_COMMUNITY): Payer: Self-pay | Admitting: Family Medicine

## 2014-01-25 ENCOUNTER — Ambulatory Visit (HOSPITAL_COMMUNITY)
Admission: RE | Admit: 2014-01-25 | Discharge: 2014-01-25 | Disposition: A | Discharge status: Home / Self Care | Payer: Disability Insurance | Source: Ambulatory Visit | Attending: Family Medicine | Admitting: Family Medicine

## 2014-01-25 DIAGNOSIS — R0989 Other specified symptoms and signs involving the circulatory and respiratory systems: Principal | ICD-10-CM | POA: Insufficient documentation

## 2014-01-25 DIAGNOSIS — R0600 Dyspnea, unspecified: Secondary | ICD-10-CM

## 2014-01-25 DIAGNOSIS — I10 Essential (primary) hypertension: Secondary | ICD-10-CM | POA: Insufficient documentation

## 2014-01-25 DIAGNOSIS — J45909 Unspecified asthma, uncomplicated: Secondary | ICD-10-CM | POA: Insufficient documentation

## 2014-01-25 DIAGNOSIS — R0609 Other forms of dyspnea: Secondary | ICD-10-CM | POA: Insufficient documentation

## 2014-01-25 DIAGNOSIS — J42 Unspecified chronic bronchitis: Secondary | ICD-10-CM | POA: Insufficient documentation

## 2014-05-24 ENCOUNTER — Ambulatory Visit: Payer: Self-pay

## 2014-06-25 ENCOUNTER — Ambulatory Visit: Payer: Self-pay

## 2014-07-27 ENCOUNTER — Ambulatory Visit: Payer: Disability Insurance | Attending: Internal Medicine | Admitting: Internal Medicine

## 2014-07-27 ENCOUNTER — Encounter: Payer: Self-pay | Admitting: Internal Medicine

## 2014-07-27 VITALS — BP 169/117 | HR 100 | Temp 98.5°F | Resp 16 | Ht 71.0 in | Wt >= 6400 oz

## 2014-07-27 DIAGNOSIS — Z79899 Other long term (current) drug therapy: Secondary | ICD-10-CM | POA: Insufficient documentation

## 2014-07-27 DIAGNOSIS — Z6841 Body Mass Index (BMI) 40.0 and over, adult: Secondary | ICD-10-CM | POA: Insufficient documentation

## 2014-07-27 DIAGNOSIS — M1711 Unilateral primary osteoarthritis, right knee: Secondary | ICD-10-CM | POA: Insufficient documentation

## 2014-07-27 DIAGNOSIS — Z713 Dietary counseling and surveillance: Secondary | ICD-10-CM | POA: Insufficient documentation

## 2014-07-27 DIAGNOSIS — K219 Gastro-esophageal reflux disease without esophagitis: Secondary | ICD-10-CM | POA: Insufficient documentation

## 2014-07-27 DIAGNOSIS — M1990 Unspecified osteoarthritis, unspecified site: Secondary | ICD-10-CM

## 2014-07-27 DIAGNOSIS — I10 Essential (primary) hypertension: Secondary | ICD-10-CM | POA: Insufficient documentation

## 2014-07-27 DIAGNOSIS — Z0000 Encounter for general adult medical examination without abnormal findings: Secondary | ICD-10-CM

## 2014-07-27 LAB — POCT URINALYSIS DIPSTICK
BILIRUBIN UA: NEGATIVE
Glucose, UA: NEGATIVE
KETONES UA: NEGATIVE
LEUKOCYTES UA: NEGATIVE
Nitrite, UA: NEGATIVE
PH UA: 6.5
Protein, UA: NEGATIVE
Spec Grav, UA: 1.02
Urobilinogen, UA: 2

## 2014-07-27 MED ORDER — AMLODIPINE BESYLATE 10 MG PO TABS: 10.0000 mg | ORAL_TABLET | Freq: Every day | ORAL | Status: DC

## 2014-07-27 MED ORDER — MUPIROCIN 2 % EX OINT: 1.0000 "application " | TOPICAL_OINTMENT | Freq: Two times a day (BID) | CUTANEOUS | Status: DC

## 2014-07-27 MED ORDER — OMEPRAZOLE 20 MG PO CPDR: 20.0000 mg | DELAYED_RELEASE_CAPSULE | Freq: Every day | ORAL | Status: DC

## 2014-07-27 NOTE — Patient Instructions (Signed)
Exercise to Lose Weight Exercise and a healthy diet may help you lose weight. Your doctor may suggest specific exercises. EXERCISE IDEAS AND TIPS  Choose low-cost things you enjoy doing, such as walking, bicycling, or exercising to workout videos.  Take stairs instead of the elevator.  Walk during your lunch break.  Park your car further away from work or school.  Go to a gym or an exercise class.  Start with 5 to 10 minutes of exercise each day. Build up to 30 minutes of exercise 4 to 6 days a week.  Wear shoes with good support and comfortable clothes.  Stretch before and after working out.  Work out until you breathe harder and your heart beats faster.  Drink extra water when you exercise.  Do not do so much that you hurt yourself, feel dizzy, or get very short of breath. Exercises that burn about 150 calories:  Running 1  miles in 15 minutes.  Playing volleyball for 45 to 60 minutes.  Washing and waxing a car for 45 to 60 minutes.  Playing touch football for 45 minutes.  Walking 1  miles in 35 minutes.  Pushing a stroller 1  miles in 30 minutes.  Playing basketball for 30 minutes.  Raking leaves for 30 minutes.  Bicycling 5 miles in 30 minutes.  Walking 2 miles in 30 minutes.  Dancing for 30 minutes.  Shoveling snow for 15 minutes.  Swimming laps for 20 minutes.  Walking up stairs for 15 minutes.  Bicycling 4 miles in 15 minutes.  Gardening for 30 to 45 minutes.  Jumping rope for 15 minutes.  Washing windows or floors for 45 to 60 minutes. Document Released: 10/03/2010 Document Revised: 11/23/2011 Document Reviewed: 10/03/2010 [facility] Patient Information 2015 [place], [place]. This information is not intended to replace advice given to you by your health care provider. Make sure you discuss any questions you have with your health care provider. Hypertension Hypertension is another name for high blood pressure. High blood pressure forces your  heart to work harder to pump blood. A blood pressure reading has two numbers, which includes a higher number over a lower number (example: 110/72). HOME CARE   Have your blood pressure rechecked by your doctor.  Only take medicine as told by your doctor. Follow the directions carefully. The medicine does not work as well if you skip doses. Skipping doses also puts you at risk for problems.  Do not smoke.  Monitor your blood pressure at home as told by your doctor. GET HELP IF:  You think you are having a reaction to the medicine you are taking.  You have repeat headaches or feel dizzy.  You have puffiness (swelling) in your ankles.  You have trouble with your vision. GET HELP RIGHT AWAY IF:   You get a very bad headache and are confused.  You feel weak, numb, or faint.  You get chest or belly (abdominal) pain.  You throw up (vomit).  You cannot breathe very well. MAKE SURE YOU:   Understand these instructions.  Will watch your condition.  Will get help right away if you are not doing well or get worse. Document Released: 02/17/2008 Document Revised: 09/05/2013 Document Reviewed: 06/23/2013 [facility] Patient Information 2015 [place], [place]. This information is not intended to replace advice given to you by your health care provider. Make sure you discuss any questions you have with your health care provider.

## 2014-07-27 NOTE — Progress Notes (Signed)
Patient ID: Ernest Myers, male   DOB: 09/01/1971, 43 y.o.   MRN: [id number]  [id number]  [id number]  DOB - 01/18/1971  CC:  Chief Complaint  Patient presents with  . Follow-up       HPI: Ernest Myers is a 43 y.o. male with a past medical history of asthma, sleep apnea, and hypertension.  He has not been on any anti-hypertensive medications for over one year.  He states that he has not had insurance and has not been able to afford his medications.  He c/o of umbilical pian and drainage for 6 months.  He denies blisters or lesions near or on the umbilical.  He currently uses a CPAP machine at night.  He c/o of severe pain in his right knee for past two years due to arthritis.  The pain is described as a achy pain. He has not tried any meds for pain.   Has had malodorous urine for " a while".  He states that he can smell it through his clothes. He reports occsional alcohol intake. He denies dysuria, hematuria, or flank pain.     No Known Allergies Past Medical History  Diagnosis Date  . Hypertension   . Fatty liver   . Arthritis    Current Outpatient Prescriptions on File Prior to Visit  Medication Sig Dispense Refill  . amLODipine (NORVASC) 5 MG tablet Take 5 mg by mouth daily.    . benazepril (LOTENSIN) 20 MG tablet Take 20 mg by mouth daily.    Marland Kitchen. HYDROcodone-acetaminophen (NORCO/VICODIN) 5-325 MG per tablet Take 1-2 tablets by mouth every 6 (six) hours as needed for pain. 15 tablet 0  . pantoprazole (PROTONIX) 40 MG tablet Take 1 tablet (40 mg total) by mouth daily. 30 tablet 0   No current facility-administered medications on file prior to visit.   Family History  Problem Relation Age of Onset  . Diabetes Mother   . Heart disease Father    History   Social History  . Marital Status: Single    Spouse Name: N/A    Number of Children: N/A  . Years of Education: N/A   Occupational History  . Not on file.   Social History Main Topics  . Smoking status: Never  Smoker   . Smokeless tobacco: Never Used  . Alcohol Use: Yes     Comment: social  . Drug Use: No  . Sexual Activity: Not on file   Other Topics Concern  . Not on file   Social History Narrative    Review of Systems  Eyes: Negative.   Respiratory: Positive for shortness of breath (asthma). Negative for cough.   Cardiovascular: Positive for leg swelling. Negative for chest pain.  Gastrointestinal: Positive for heartburn and abdominal pain. Negative for nausea, vomiting and blood in stool.  Musculoskeletal: Positive for back pain and joint pain.  Neurological: Positive for tingling (left hand) and headaches. Negative for dizziness.  Psychiatric/Behavioral: Negative for depression and substance abuse. The patient is not nervous/anxious.      Objective:   Filed Vitals:   07/27/14 1608  BP: 169/117  Pulse: 100  Temp: 98.5 F (36.9 C)  Resp: 16    Physical Exam: Constitutional: Patient appears well-developed and well-nourished. No distress. HENT: Normocephalic, atraumatic, External right and left ear normal. Oropharynx is clear and moist.  Eyes: Conjunctivae and EOM are normal. PERRLA, no scleral icterus. Neck: Normal ROM. Neck supple. No JVD. No tracheal deviation. No thyromegaly. CVS: RRR, S1/S2 +, no  murmurs, no gallops, no carotid bruit.  Pulmonary: Effort and breath sounds normal, no stridor, rhonchi, wheezes, rales.  Abdominal: Very obese. Soft. BS +, tenderness, rebound or guarding.  Musculoskeletal: Normal range of motion. No edema and no tenderness.  Lymphadenopathy: No lymphadenopathy noted, cervical, Neuro: Alert. Normal reflexes, muscle tone coordination. No cranial nerve deficit. Skin: Skin is warm and dry. No rash noted. Not diaphoretic. No erythema. No pallor.  Small clear malodorous drainage noted from umbilical, skin intact.   Psychiatric: Normal mood and affect. Behavior, judgment, thought content normal.  Lab Results  Component Value Date   WBC 7.7  04/11/2013   HGB 15.6 04/11/2013   HCT 44.7 04/11/2013   MCV 97.0 04/11/2013   PLT 155 04/11/2013   Lab Results  Component Value Date   CREATININE 0.70 04/11/2013   BUN 10 04/11/2013   NA 139 04/11/2013   K 3.6 04/11/2013   CL 103 04/11/2013   CO2 25 04/11/2013    Lab Results  Component Value Date   HGBA1C  04/12/2008    6.1 (NOTE)   The ADA recommends the following therapeutic goal for glycemic   control related to Hgb A1C measurement:   Goal of Therapy:   < 7.0% Hgb A1C   Reference: American Diabetes Association: Clinical Practice   Recommendations 2008, Diabetes Care,  2008, 31:(Suppl 1).   Lipid Panel     Component Value Date/Time   CHOL  04/13/2008 0415    153        ATP III CLASSIFICATION:  <200     mg/dL   Desirable  [id number]  mg/dL   Borderline High  >=045>=240    mg/dL   High   TRIG 74 40/98/119107/31/2009 0415   HDL 28* 04/13/2008 0415   CHOLHDL 5.5 04/13/2008 0415   VLDL 15 04/13/2008 0415   LDLCALC * 04/13/2008 0415    110        Total Cholesterol/HDL:CHD Risk Coronary Heart Disease Risk Table                     Men   Women  1/2 Average Risk   3.4   3.3       Assessment and plan:   Ernest Myers was seen today for follow-up.  Diagnoses and associated orders for this visit:  Essential hypertension - amLODipine (NORVASC) 10 MG tablet; Take 1 tablet (10 mg total) by mouth daily. - CBC; Future - COMPLETE METABOLIC PANEL WITH GFR; Future IF BP is still elevated on RN visit then may add losartan 25 mg.   Gastroesophageal reflux disease, esophagitis presence not specified - omeprazole (PRILOSEC) 20 MG capsule; Take 1 capsule (20 mg total) by mouth daily. For heart burn Discussed diet and weight with patient relating to acid reflux.  Went over things that may exacerbate acid reflux such as tomatoes, spicy foods, coffee, carbonated beverages, chocolates, etc.  Advised patient to avoid laying down at least two hours after meals and sleep with HOB elevated.   Morbid  obesity - Lipid panel; Future - TSH; Future - Hemoglobin A1C; Future Weight loss discussed at length and its complications to health.  Patient will loss 10 months by next visit in 3 months.  Diet and exercise discussed as well as calorie intake.  Arthritis Discuss weight loss relating to arthritis.  Preventative health care - Urinalysis Dipstick  Other Orders - mupirocin ointment (BACTROBAN) 2 %; Place 1 application into the nose 2 (two) times daily. Apply to belly button  area   Return in about 2 weeks (around 08/10/2014) for Nurse Visit-BP check and lab visit.  The patient was given clear instructions to return to medical center if symptoms don't improve, worsen or new problems develop. The patient verbalized understanding.   Holland Commons, NP-C [facility] [phone] 07/27/2014, 4:49 PM

## 2014-07-27 NOTE — Progress Notes (Signed)
Pt is here today to establish care. Pt for 2 years has had severe pain in his right knee. Pt reports having an odor to his urine. Pt has a history of asthma, sleep apnea and HTN. For 6 months he has had abdominal pain and a bump inside his belly button with drainage.

## 2014-08-08 ENCOUNTER — Ambulatory Visit: Payer: PRIVATE HEALTH INSURANCE

## 2014-08-13 ENCOUNTER — Ambulatory Visit: Payer: PRIVATE HEALTH INSURANCE | Attending: Internal Medicine

## 2014-08-13 DIAGNOSIS — I10 Essential (primary) hypertension: Secondary | ICD-10-CM

## 2014-08-13 LAB — LIPID PANEL
CHOL/HDL RATIO: 4.5 ratio
CHOLESTEROL: 168 mg/dL (ref 0–200)
HDL: 37 mg/dL — AB (ref >39)
LDL Cholesterol: 112 mg/dL — ABNORMAL HIGH (ref 0–99)
TRIGLYCERIDES: 96 mg/dL (ref <150)
VLDL: 19 mg/dL (ref 0–40)

## 2014-08-13 LAB — COMPLETE METABOLIC PANEL WITH GFR
ALK PHOS: 66 U/L (ref 39–117)
ALT: 44 U/L (ref 0–53)
AST: 48 U/L — AB (ref 0–37)
Albumin: 3.8 g/dL (ref 3.5–5.2)
BILIRUBIN TOTAL: 0.9 mg/dL (ref 0.2–1.2)
BUN: 13 mg/dL (ref 6–23)
CO2: 29 mEq/L (ref 19–32)
CREATININE: 0.75 mg/dL (ref 0.50–1.35)
Calcium: 9.6 mg/dL (ref 8.4–10.5)
Chloride: 100 mEq/L (ref 96–112)
GFR, Est African American: >89 mL/min
GFR, Est Non African American: >89 mL/min
Glucose, Bld: 138 mg/dL — ABNORMAL HIGH (ref 70–99)
Potassium: 4.5 mEq/L (ref 3.5–5.3)
Sodium: 139 mEq/L (ref 135–145)
Total Protein: 7.6 g/dL (ref 6.0–8.3)

## 2014-08-13 LAB — CBC
HEMATOCRIT: 47.3 % (ref 39.0–52.0)
HEMOGLOBIN: 16.5 g/dL (ref 13.0–17.0)
MCH: 34.6 pg — ABNORMAL HIGH (ref 26.0–34.0)
MCHC: 34.9 g/dL (ref 30.0–36.0)
MCV: 99.2 fL (ref 78.0–100.0)
MPV: 12.2 fL (ref 9.4–12.4)
PLATELETS: 119 10*3/uL — AB (ref 150–400)
RBC: 4.77 MIL/uL (ref 4.22–5.81)
RDW: 13.7 % (ref 11.5–15.5)
WBC: 9.2 10*3/uL (ref 4.0–10.5)

## 2014-08-13 LAB — TSH: TSH: 1.398 u[IU]/mL (ref 0.350–4.500)

## 2014-08-14 ENCOUNTER — Telehealth: Payer: Self-pay | Admitting: *Deleted

## 2014-08-14 LAB — HEMOGLOBIN A1C
Hgb A1c MFr Bld: 6.1 % — ABNORMAL HIGH (ref <5.7)
MEAN PLASMA GLUCOSE: 128 mg/dL — AB (ref <117)

## 2014-08-14 NOTE — Telephone Encounter (Signed)
Pt aware of lab results, advice to maintain a low fat low carbohydrate diet and exercise

## 2014-08-14 NOTE — Telephone Encounter (Signed)
-----   Message from Ambrose FinlandValerie A Keck, NP sent at 08/14/2014 10:43 AM EST ----- Labs are normal with the exception of slightly elevated LDL. Please educate appropriately. Borderline diabetic--weight loss, diet, exercise.

## 2014-08-31 ENCOUNTER — Ambulatory Visit: Payer: PRIVATE HEALTH INSURANCE | Attending: Internal Medicine

## 2014-08-31 ENCOUNTER — Telehealth: Payer: Self-pay | Admitting: Internal Medicine

## 2014-08-31 NOTE — Telephone Encounter (Signed)
Pt. Came into facility to request an Rx for his arthritis in his knees, pt states that it is very painful to walk and would like something to treat.. Patient also states that he has asthma and believes that he needs an inhaler to help him breathe. Patient states that he is a little congested and short of breath. Please f/u with pt.

## 2014-09-05 NOTE — Telephone Encounter (Signed)
Pt. Came into facility to request an Rx for his arthritis in his knees, pt states that it is very painful to walk and would like something to treat.. Patient also states that he has asthma and believes that he needs an inhaler to help him breathe. Patient states that he is a little congested and short of breath. Please f/u with pt.   Can we prescribe Meloxicam or other NSAIDS?

## 2014-09-09 NOTE — Telephone Encounter (Signed)
He may make a appointment with the number of concerns that he has.

## 2014-10-03 ENCOUNTER — Ambulatory Visit: Payer: PRIVATE HEALTH INSURANCE | Attending: Internal Medicine

## 2014-12-03 ENCOUNTER — Ambulatory Visit: Payer: Disability Insurance | Attending: Internal Medicine | Admitting: Family Medicine

## 2014-12-03 ENCOUNTER — Encounter: Payer: Self-pay | Admitting: Family Medicine

## 2014-12-03 VITALS — BP 151/91 | HR 100 | Temp 98.3°F | Resp 14 | Ht 70.0 in | Wt >= 6400 oz

## 2014-12-03 DIAGNOSIS — I10 Essential (primary) hypertension: Secondary | ICD-10-CM

## 2014-12-03 DIAGNOSIS — R202 Paresthesia of skin: Secondary | ICD-10-CM | POA: Insufficient documentation

## 2014-12-03 DIAGNOSIS — M545 Low back pain, unspecified: Secondary | ICD-10-CM

## 2014-12-03 DIAGNOSIS — M7702 Medial epicondylitis, left elbow: Secondary | ICD-10-CM | POA: Insufficient documentation

## 2014-12-03 MED ORDER — MELOXICAM 7.5 MG PO TABS: 7.5000 mg | ORAL_TABLET | Freq: Every day | ORAL | Status: DC

## 2014-12-03 MED ORDER — AMLODIPINE BESYLATE 10 MG PO TABS: 10.0000 mg | ORAL_TABLET | Freq: Every day | ORAL | Status: DC

## 2014-12-03 MED ORDER — BENAZEPRIL HCL 20 MG PO TABS: 20.0000 mg | ORAL_TABLET | Freq: Every day | ORAL | Status: DC

## 2014-12-03 NOTE — Progress Notes (Signed)
° °  Subjective:    Patient ID: Ernest Myers, male    DOB: 12/22/1970, 44 y.o.   MRN: [id number]  HPI  He comes in for an acute visit complaining of lower back pain 10/10 x2 weeks and has not taken any OTC medications for it. Denies radiation of pain , numbness or weakness in legs and denies loss loss of sphincteric function; he thinks his back is swollen in the lower back. Also complains on R  Arm swelling for years and now it huts, occasionally has tingling in fingers    Review of Systems  Constitutional: Negative.   HENT: Negative.   Respiratory: Negative for cough, chest tightness and shortness of breath.   Cardiovascular: Negative for chest pain and palpitations.  Gastrointestinal: Negative.   Musculoskeletal:       See HPI  Neurological:       See HPI  Psychiatric/Behavioral: Negative.        Objective:   Physical Exam  Constitutional: He appears well-developed and well-nourished.  Morbidly obese  Cardiovascular: Normal rate, regular rhythm and normal heart sounds.   Pulmonary/Chest: Effort normal and breath sounds normal. No respiratory distress.  Abdominal: Soft.  Musculoskeletal:       Right elbow: He exhibits no swelling. No tenderness found. No medial epicondyle tenderness noted.       Lumbar back: He exhibits no tenderness, no bony tenderness, no swelling and no spasm.  Psychiatric:  Normal          Assessment & Plan:  44 year old morbidly obese man who presents with low back pain of acute onset and left medial epicondyle pain.

## 2014-12-03 NOTE — Patient Instructions (Addendum)
Chronic Back Pain  When back pain lasts longer than 3 months, it is called chronic back pain.People with chronic back pain often go through certain periods that are more intense (flare-ups).  CAUSES Chronic back pain can be caused by wear and tear (degeneration) on different structures in your back. These structures include:  The bones of your spine (vertebrae) and the joints surrounding your spinal cord and nerve roots (facets).  The strong, fibrous tissues that connect your vertebrae (ligaments). Degeneration of these structures may result in pressure on your nerves. This can lead to constant pain. HOME CARE INSTRUCTIONS  Avoid bending, heavy lifting, prolonged sitting, and activities which make the problem worse.  Take brief periods of rest throughout the day to reduce your pain. Lying down or standing usually is better than sitting while you are resting.  Take over-the-counter or prescription medicines only as directed by your caregiver. SEEK IMMEDIATE MEDICAL CARE IF:   You have weakness or numbness in one of your legs or feet.  You have trouble controlling your bladder or bowels.  You have nausea, vomiting, abdominal pain, shortness of breath, or fainting. Document Released: 10/08/2004 Document Revised: 11/23/2011 Document Reviewed: 08/15/2011 [facility] Patient Information 2015 [place], [place]. This information is not intended to replace advice given to you by your health care provider. Make sure you discuss any questions you have with your health care provider. Low-Sodium Eating Plan Sodium raises blood pressure and causes water to be held in the body. Getting less sodium from food will help lower your blood pressure, reduce any swelling, and protect your heart, liver, and kidneys. We get sodium by adding salt (sodium chloride) to food. Most of our sodium comes from canned, boxed, and frozen foods. Restaurant foods, fast foods, and pizza are also very high in sodium. Even if you take  medicine to lower your blood pressure or to reduce fluid in your body, getting less sodium from your food is important. WHAT IS MY PLAN? Most people should limit their sodium intake to 2,300 mg a day. Your health care provider recommends that you limit your sodium intake to __________ a day.  WHAT DO I NEED TO KNOW ABOUT THIS EATING PLAN? For the low-sodium eating plan, you will follow these general guidelines:  Choose foods with a % Daily Value for sodium of less than 5% (as listed on the food label).   Use salt-free seasonings or herbs instead of table salt or sea salt.   Check with your health care provider or pharmacist before using salt substitutes.   Eat fresh foods.  Eat more vegetables and fruits.  Limit canned vegetables. If you do use them, rinse them well to decrease the sodium.   Limit cheese to 1 oz (28 g) per day.   Eat lower-sodium products, often labeled as "lower sodium" or "no salt added."  Avoid foods that contain monosodium glutamate (MSG). MSG is sometimes added to [place] food and some canned foods.  Check food labels (Nutrition Facts labels) on foods to learn how much sodium is in one serving.  Eat more home-cooked food and less restaurant, buffet, and fast food.  When eating at a restaurant, ask that your food be prepared with less salt or none, if possible.  HOW DO I READ FOOD LABELS FOR SODIUM INFORMATION? The Nutrition Facts label lists the amount of sodium in one serving of the food. If you eat more than one serving, you must multiply the listed amount of sodium by the number of servings.  Food labels may also identify foods as:  Sodium free--Less than 5 mg in a serving.  Very low sodium--35 mg or less in a serving.  Low sodium--140 mg or less in a serving.  Light in sodium--50% less sodium in a serving. For example, if a food that usually has 300 mg of sodium is changed to become light in sodium, it will have 150 mg of sodium.  Reduced  sodium--25% less sodium in a serving. For example, if a food that usually has 400 mg of sodium is changed to reduced sodium, it will have 300 mg of sodium. WHAT FOODS CAN I EAT? Grains Low-sodium cereals, including oats, puffed wheat and rice, and shredded wheat cereals. Low-sodium crackers. Unsalted rice and pasta. Lower-sodium bread.  Vegetables Frozen or fresh vegetables. Low-sodium or reduced-sodium canned vegetables. Low-sodium or reduced-sodium tomato sauce and paste. Low-sodium or reduced-sodium tomato and vegetable juices.  Fruits Fresh, frozen, and canned fruit. Fruit juice.  Meat and Other Protein Products Low-sodium canned tuna and salmon. Fresh or frozen meat, poultry, seafood, and fish. Lamb. Unsalted nuts. Dried beans, peas, and lentils without added salt. Unsalted canned beans. Homemade soups without salt. Eggs.  Dairy Milk. Soy milk. Ricotta cheese. Low-sodium or reduced-sodium cheeses. Yogurt.  Condiments Fresh and dried herbs and spices. Salt-free seasonings. Onion and garlic powders. Low-sodium varieties of mustard and ketchup. Lemon juice.  Fats and Oils Reduced-sodium salad dressings. Unsalted butter.  Other Unsalted popcorn and pretzels.  The items listed above may not be a complete list of recommended foods or beverages. Contact your dietitian for more options. WHAT FOODS ARE NOT RECOMMENDED? Grains Instant hot cereals. Bread stuffing, pancake, and biscuit mixes. Croutons. Seasoned rice or pasta mixes. Noodle soup cups. Boxed or frozen macaroni and cheese. Self-rising flour. Regular salted crackers. Vegetables Regular canned vegetables. Regular canned tomato sauce and paste. Regular tomato and vegetable juices. Frozen vegetables in sauces. Salted french fries. Olives. Rosita FirePickles. Relishes. Sauerkraut. Salsa. Meat and Other Protein Products Salted, canned, smoked, spiced, or pickled meats, seafood, or fish. Bacon, ham, sausage, hot dogs, corned beef, chipped  beef, and packaged luncheon meats. Salt pork. Jerky. Pickled herring. Anchovies, regular canned tuna, and sardines. Salted nuts. Dairy Processed cheese and cheese spreads. Cheese curds. Blue cheese and cottage cheese. Buttermilk.  Condiments Onion and garlic salt, seasoned salt, table salt, and sea salt. Canned and packaged gravies. Worcestershire sauce. Tartar sauce. Barbecue sauce. Teriyaki sauce. Soy sauce, including reduced sodium. Steak sauce. Fish sauce. Oyster sauce. Cocktail sauce. Horseradish. Regular ketchup and mustard. Meat flavorings and tenderizers. Bouillon cubes. Hot sauce. Tabasco sauce. Marinades. Taco seasonings. Relishes. Fats and Oils Regular salad dressings. Salted butter. Margarine. Ghee. Bacon fat.  Other Potato and tortilla chips. Corn chips and puffs. Salted popcorn and pretzels. Canned or dried soups. Pizza. Frozen entrees and pot pies.  The items listed above may not be a complete list of foods and beverages to avoid. Contact your dietitian for more information. Document Released: 02/20/2002 Document Revised: 09/05/2013 Document Reviewed: 07/05/2013 [facility] Patient Information 2015 [place], [place]. This information is not intended to replace advice given to you by your health care provider. Make sure you discuss any questions you have with your health care provider.

## 2014-12-03 NOTE — Progress Notes (Signed)
Pt is here following up on his HTN and his chronic lower back pain. Pt states that his right arm is swollen and hurting everyday. Pt has been unable to get his medications.

## 2014-12-03 NOTE — Assessment & Plan Note (Signed)
BP is not at goal of <140/90 Refilled antihypertensives and compliance emphasized

## 2014-12-03 NOTE — Assessment & Plan Note (Signed)
Unknown etiology I have explained to him that his weight is largely contributory I am placing him on an NSAID and will reassess for improvement of symptoms at his next office visit

## 2014-12-03 NOTE — Assessment & Plan Note (Signed)
Will screen him for Diabetes

## 2014-12-06 ENCOUNTER — Ambulatory Visit: Payer: Disability Insurance | Attending: Internal Medicine

## 2014-12-06 DIAGNOSIS — I10 Essential (primary) hypertension: Secondary | ICD-10-CM

## 2014-12-06 DIAGNOSIS — R202 Paresthesia of skin: Secondary | ICD-10-CM

## 2014-12-06 LAB — COMPREHENSIVE METABOLIC PANEL
ALBUMIN: 4.1 g/dL (ref 3.5–5.2)
ALK PHOS: 65 U/L (ref 39–117)
ALT: 56 U/L — AB (ref 0–53)
AST: 62 U/L — ABNORMAL HIGH (ref 0–37)
BILIRUBIN TOTAL: 0.8 mg/dL (ref 0.2–1.2)
BUN: 11 mg/dL (ref 6–23)
CO2: 28 meq/L (ref 19–32)
Calcium: 9.2 mg/dL (ref 8.4–10.5)
Chloride: 100 mEq/L (ref 96–112)
Creat: 0.76 mg/dL (ref 0.50–1.35)
GLUCOSE: 127 mg/dL — AB (ref 70–99)
POTASSIUM: 4.5 meq/L (ref 3.5–5.3)
SODIUM: 137 meq/L (ref 135–145)
TOTAL PROTEIN: 7.5 g/dL (ref 6.0–8.3)

## 2014-12-06 LAB — LIPID PANEL
CHOL/HDL RATIO: 5.8 ratio
Cholesterol: 162 mg/dL (ref 0–200)
HDL: 28 mg/dL — AB (ref >=40)
LDL CALC: 118 mg/dL — AB (ref 0–99)
TRIGLYCERIDES: 78 mg/dL (ref <150)
VLDL: 16 mg/dL (ref 0–40)

## 2014-12-06 LAB — HEMOGLOBIN A1C
Hgb A1c MFr Bld: 5.8 % — ABNORMAL HIGH (ref <5.7)
MEAN PLASMA GLUCOSE: 120 mg/dL — AB (ref <117)

## 2014-12-07 ENCOUNTER — Telehealth: Payer: Self-pay | Admitting: *Deleted

## 2014-12-07 NOTE — Telephone Encounter (Signed)
Pt is aware of his lab results. 

## 2014-12-07 NOTE — Telephone Encounter (Signed)
-----   Message from Jaclyn ShaggyEnobong Amao, MD sent at 12/07/2014  8:46 AM EDT ----- Please inform him that he is not diabetic, his liver enzymes are mildly elevated and if he does consume alcohol would need him to work on quitting as this could be a cause. We will repeat his liver enzymes in the next 3 months to ensure that they have normalized. Thank you.

## 2015-01-03 ENCOUNTER — Ambulatory Visit: Payer: Disability Insurance | Admitting: Internal Medicine

## 2015-01-08 ENCOUNTER — Ambulatory Visit: Payer: Disability Insurance | Attending: Internal Medicine | Admitting: Internal Medicine

## 2015-01-08 ENCOUNTER — Encounter: Payer: Self-pay | Admitting: Internal Medicine

## 2015-01-08 VITALS — BP 161/105 | HR 103 | Temp 97.9°F | Resp 16 | Ht 70.0 in | Wt >= 6400 oz

## 2015-01-08 DIAGNOSIS — G8929 Other chronic pain: Secondary | ICD-10-CM

## 2015-01-08 DIAGNOSIS — M549 Dorsalgia, unspecified: Secondary | ICD-10-CM

## 2015-01-08 DIAGNOSIS — I10 Essential (primary) hypertension: Secondary | ICD-10-CM

## 2015-01-08 DIAGNOSIS — Z6841 Body Mass Index (BMI) 40.0 and over, adult: Secondary | ICD-10-CM

## 2015-01-08 MED ORDER — CLONIDINE HCL 0.1 MG PO TABS
0.1000 mg | ORAL_TABLET | Freq: Once | ORAL | Status: AC
  Administered 2015-01-08: 0.1 mg via ORAL

## 2015-01-08 MED ORDER — TRAMADOL HCL 50 MG PO TABS: 50.0000 mg | ORAL_TABLET | Freq: Two times a day (BID) | ORAL | Status: DC | PRN

## 2015-01-08 NOTE — Patient Instructions (Signed)

## 2015-01-08 NOTE — Progress Notes (Signed)
Patient ID: Ernest Myers, male   DOB: [birth year], 44 y.o.   MRN: [id number]  CC: back pain  HPI: [name] is a 44 y.o. male here today for a follow up visit.  Patient has past medical history of hypertension, arthritis, and fatty liver.  He presents today with complaints of ongoing chronic back pain.  Patient rates back pain as a 10/10 that has been present for 2-3 years.  The pain is aggravated by walking. Nothing relieves his pain. The pain is sharp and radiates down both buttocks. No bowel or bladder dysfunction. He believes his right arm has been swelling and very painful for over one month.  He has been prescribed meloxicam in the past but reports that it is not helping with his pain.  He has been out of his BP medication for a few days. He plans to take his medication daily from this point on.    Patient has No headache, No chest pain, No abdominal pain - No Nausea, No new weakness tingling or numbness, No Cough - SOB.  No Known Allergies Past Medical History  Diagnosis Date  . Hypertension   . Fatty liver   . Arthritis    Current Outpatient Prescriptions on File Prior to Visit  Medication Sig Dispense Refill  . amLODipine (NORVASC) 10 MG tablet Take 1 tablet (10 mg total) by mouth daily. 30 tablet 3  . benazepril (LOTENSIN) 20 MG tablet Take 1 tablet (20 mg total) by mouth daily. 30 tablet 2  . meloxicam (MOBIC) 7.5 MG tablet Take 1 tablet (7.5 mg total) by mouth daily. (Patient not taking: Reported on 01/08/2015) 30 tablet 1   No current facility-administered medications on file prior to visit.   Family History  Problem Relation Age of Onset  . Diabetes Mother   . Heart disease Father    History   Social History  . Marital Status: Single    Spouse Name: N/A  . Number of Children: N/A  . Years of Education: N/A   Occupational History  . Not on file.   Social History Main Topics  . Smoking status: Never Smoker   . Smokeless tobacco: Never Used  . Alcohol Use: Yes      Comment: social  . Drug Use: No  . Sexual Activity: Not on file   Other Topics Concern  . Not on file   Social History Narrative    Review of Systems: See HPI    Objective:   Filed Vitals:   01/08/15 0948  BP: 161/105  Pulse: 103  Temp: 97.9 F (36.6 C)  Resp: 16    Physical Exam  Constitutional: He is oriented to person, place, and time.  Cardiovascular: Normal rate, regular rhythm and normal heart sounds.   Pulmonary/Chest: Effort normal and breath sounds normal.  Musculoskeletal: He exhibits tenderness (lumbar spine). He exhibits no edema.  Neurological: He is alert and oriented to person, place, and time.  Skin: Skin is warm and dry.     Lab Results  Component Value Date   WBC 9.2 08/13/2014   HGB 16.5 08/13/2014   HCT 47.3 08/13/2014   MCV 99.2 08/13/2014   PLT 119* 08/13/2014   Lab Results  Component Value Date   CREATININE 0.76 12/06/2014   BUN 11 12/06/2014   NA 137 12/06/2014   K 4.5 12/06/2014   CL 100 12/06/2014   CO2 28 12/06/2014    Lab Results  Component Value Date   HGBA1C 5.8* 12/06/2014  Lipid Panel     Component Value Date/Time   CHOL 162 12/06/2014 0910   TRIG 78 12/06/2014 0910   HDL 28* 12/06/2014 0910   CHOLHDL 5.8 12/06/2014 0910   VLDL 16 12/06/2014 0910   LDLCALC 118* 12/06/2014 0910       Assessment and plan:   Ernest Myers was seen today for follow-up.  Diagnoses and all orders for this visit:  Essential hypertension Orders: -     cloNIDine (CATAPRES) tablet 0.1 mg; Take 1 tablet (0.1 mg total) by mouth once. I have address non-compliance with patient and related complications. I do believe the patient is not taking medication as reported.  Morbid obesity with BMI of 50.0-59.9, adult Weight loss discussed at length and its complications to health.  Patient will loss 10 months by next visit in 3 months.  Diet and exercise discussed as well as calorie intake. Weight loss has been stressed several times    Chronic back pain Orders: -     traMADol (ULTRAM) 50 MG tablet; Take 1 tablet (50 mg total) by mouth every 12 (twelve) hours as needed. Patient will apply for orange card so he may get referral to ortho. I have again addressed weight relating to pain   Return in about 2 weeks (around 01/22/2015) for Nurse Visit-BP check and 3 mo PCP.       Holland CommonsKECK, VALERIE, NP-C [facility] [phone] 01/08/2015, 10:18 AM

## 2015-01-08 NOTE — Progress Notes (Signed)
Pt is here following up on his severe chronic lower back pian. Pt states that his right arm keeps swelling.

## 2015-01-24 ENCOUNTER — Ambulatory Visit: Payer: Disability Insurance | Attending: Internal Medicine | Admitting: *Deleted

## 2015-01-24 ENCOUNTER — Telehealth: Payer: Self-pay | Admitting: Clinical

## 2015-01-24 VITALS — BP 126/77 | HR 92 | Temp 98.3°F | Resp 16

## 2015-01-24 DIAGNOSIS — R12 Heartburn: Secondary | ICD-10-CM | POA: Insufficient documentation

## 2015-01-24 DIAGNOSIS — Z791 Long term (current) use of non-steroidal anti-inflammatories (NSAID): Secondary | ICD-10-CM | POA: Insufficient documentation

## 2015-01-24 DIAGNOSIS — I10 Essential (primary) hypertension: Secondary | ICD-10-CM

## 2015-01-24 MED ORDER — OMEPRAZOLE 20 MG PO CPDR: 20.0000 mg | DELAYED_RELEASE_CAPSULE | Freq: Every day | ORAL | Status: DC

## 2015-01-24 NOTE — Telephone Encounter (Signed)
Completed call, see above 

## 2015-01-24 NOTE — Progress Notes (Signed)
Patient presents for BP check Med list reviewed; states taking all meds as directed Discussed need for low sodium diet and using Mrs. Dash as alternative to salt Encouraged to choose foods with 5% or less of daily value for sodium. Discussed walking 30 minutes per day for exercise Patient denies headaches, blurred vision, SHOB, chest pain  C/o daily heartburn  Filed Vitals:   01/24/15 1026  BP: 126/77  Pulse: 92  Temp: 98.3 F (36.8 C)  Resp: 16    Per PCP: Omeprazole 20 mg daily  Patient advised to call for med refills at least 7 days before running out so as not to go without.  Patient aware that he is to f/u with PCP 3 months from last visit (Due 04/09/15)  Patient given literature on DASH Eating Plan

## 2015-01-24 NOTE — Patient Instructions (Signed)
DASH Eating Plan °DASH stands for "Dietary Approaches to Stop Hypertension." The DASH eating plan is a healthy eating plan that has been shown to reduce high blood pressure (hypertension). Additional health benefits may include reducing the risk of type 2 diabetes mellitus, heart disease, and stroke. The DASH eating plan may also help with weight loss. °WHAT DO I NEED TO KNOW ABOUT THE DASH EATING PLAN? °For the DASH eating plan, you will follow these general guidelines: °· Choose foods with a percent daily value for sodium of less than 5% (as listed on the food label). °· Use salt-free seasonings or herbs instead of table salt or sea salt. °· Check with your health care provider or pharmacist before using salt substitutes. °· Eat lower-sodium products, often labeled as "lower sodium" or "no salt added." °· Eat fresh foods. °· Eat more vegetables, fruits, and low-fat dairy products. °· Choose whole grains. Look for the word "whole" as the first word in the ingredient list. °· Choose fish and skinless chicken or turkey more often than red meat. Limit fish, poultry, and meat to 6 oz (170 g) each day. °· Limit sweets, desserts, sugars, and sugary drinks. °· Choose heart-healthy fats. °· Limit cheese to 1 oz (28 g) per day. °· Eat more home-cooked food and less restaurant, buffet, and fast food. °· Limit fried foods. °· Mudry foods using methods other than frying. °· Limit canned vegetables. If you do use them, rinse them well to decrease the sodium. °· When eating at a restaurant, ask that your food be prepared with less salt, or no salt if possible. °WHAT FOODS CAN I EAT? °Seek help from a dietitian for individual calorie needs. °Grains °Whole grain or whole wheat bread. Brown rice. Whole grain or whole wheat pasta. Quinoa, bulgur, and whole grain cereals. Low-sodium cereals. Corn or whole wheat flour tortillas. Whole grain cornbread. Whole grain crackers. Low-sodium crackers. °Vegetables °Fresh or frozen vegetables  (raw, steamed, roasted, or grilled). Low-sodium or reduced-sodium tomato and vegetable juices. Low-sodium or reduced-sodium tomato sauce and paste. Low-sodium or reduced-sodium canned vegetables.  °Fruits °All fresh, canned (in natural juice), or frozen fruits. °Meat and Other Protein Products °Ground beef (85% or leaner), grass-fed beef, or beef trimmed of fat. Skinless chicken or turkey. Ground chicken or turkey. Pork trimmed of fat. All fish and seafood. Eggs. Dried beans, peas, or lentils. Unsalted nuts and seeds. Unsalted canned beans. °Dairy °Low-fat dairy products, such as skim or 1% milk, 2% or reduced-fat cheeses, low-fat ricotta or cottage cheese, or plain low-fat yogurt. Low-sodium or reduced-sodium cheeses. °Fats and Oils °Tub margarines without trans fats. Light or reduced-fat mayonnaise and salad dressings (reduced sodium). Avocado. Safflower, olive, or canola oils. Natural peanut or almond butter. °Other °Unsalted popcorn and pretzels. °The items listed above may not be a complete list of recommended foods or beverages. Contact your dietitian for more options. °WHAT FOODS ARE NOT RECOMMENDED? °Grains °White bread. White pasta. White rice. Refined cornbread. Bagels and croissants. Crackers that contain trans fat. °Vegetables °Creamed or fried vegetables. Vegetables in a cheese sauce. Regular canned vegetables. Regular canned tomato sauce and paste. Regular tomato and vegetable juices. °Fruits °Dried fruits. Canned fruit in light or heavy syrup. Fruit juice. °Meat and Other Protein Products °Fatty cuts of meat. Ribs, chicken wings, bacon, sausage, bologna, salami, chitterlings, fatback, hot dogs, bratwurst, and packaged luncheon meats. Salted nuts and seeds. Canned beans with salt. °Dairy °Whole or 2% milk, cream, half-and-half, and cream cheese. Whole-fat or sweetened yogurt. Full-fat   cheeses or blue cheese. Nondairy creamers and whipped toppings. Processed cheese, cheese spreads, or cheese  curds. °Condiments °Onion and garlic salt, seasoned salt, table salt, and sea salt. Canned and packaged gravies. Worcestershire sauce. Tartar sauce. Barbecue sauce. Teriyaki sauce. Soy sauce, including reduced sodium. Steak sauce. Fish sauce. Oyster sauce. Cocktail sauce. Horseradish. Ketchup and mustard. Meat flavorings and tenderizers. Bouillon cubes. Hot sauce. Tabasco sauce. Marinades. Taco seasonings. Relishes. °Fats and Oils °Butter, stick margarine, lard, shortening, ghee, and bacon fat. Coconut, palm kernel, or palm oils. Regular salad dressings. °Other °Pickles and olives. Salted popcorn and pretzels. °The items listed above may not be a complete list of foods and beverages to avoid. Contact your dietitian for more information. °WHERE CAN I FIND MORE INFORMATION? °National Heart, Lung, and [employer] Institute: www.nhlbi.nih.gov/health/health-topics/topics/dash/ °Document Released: 08/20/2011 Document Revised: 01/15/2014 Document Reviewed: 07/05/2013 °ExitCare® Patient Information ©2015 [employer]. This information is not intended to replace advice given to you by your health care provider. Make sure you discuss any questions you have with your health care provider. ° °

## 2015-01-29 ENCOUNTER — Telehealth: Payer: Self-pay | Admitting: Clinical

## 2015-01-29 NOTE — Telephone Encounter (Signed)
Completed, see above

## 2015-04-08 ENCOUNTER — Ambulatory Visit: Payer: Disability Insurance | Attending: Internal Medicine | Admitting: Internal Medicine

## 2015-04-08 ENCOUNTER — Encounter: Payer: Self-pay | Admitting: Internal Medicine

## 2015-04-08 DIAGNOSIS — M79601 Pain in right arm: Secondary | ICD-10-CM | POA: Insufficient documentation

## 2015-04-08 DIAGNOSIS — M25561 Pain in right knee: Secondary | ICD-10-CM | POA: Insufficient documentation

## 2015-04-08 DIAGNOSIS — R634 Abnormal weight loss: Secondary | ICD-10-CM | POA: Insufficient documentation

## 2015-04-08 DIAGNOSIS — K760 Fatty (change of) liver, not elsewhere classified: Secondary | ICD-10-CM | POA: Insufficient documentation

## 2015-04-08 DIAGNOSIS — I10 Essential (primary) hypertension: Secondary | ICD-10-CM | POA: Insufficient documentation

## 2015-04-08 DIAGNOSIS — Z6841 Body Mass Index (BMI) 40.0 and over, adult: Secondary | ICD-10-CM | POA: Insufficient documentation

## 2015-04-08 DIAGNOSIS — F329 Major depressive disorder, single episode, unspecified: Secondary | ICD-10-CM

## 2015-04-08 DIAGNOSIS — M1990 Unspecified osteoarthritis, unspecified site: Secondary | ICD-10-CM | POA: Insufficient documentation

## 2015-04-08 DIAGNOSIS — M79606 Pain in leg, unspecified: Secondary | ICD-10-CM | POA: Insufficient documentation

## 2015-04-08 DIAGNOSIS — K429 Umbilical hernia without obstruction or gangrene: Secondary | ICD-10-CM | POA: Insufficient documentation

## 2015-04-08 DIAGNOSIS — Z79899 Other long term (current) drug therapy: Secondary | ICD-10-CM | POA: Insufficient documentation

## 2015-04-08 DIAGNOSIS — F32A Depression, unspecified: Secondary | ICD-10-CM

## 2015-04-08 MED ORDER — BENAZEPRIL HCL 20 MG PO TABS: 20.0000 mg | ORAL_TABLET | Freq: Every day | ORAL | Status: DC

## 2015-04-08 MED ORDER — AMLODIPINE BESYLATE 10 MG PO TABS: 10.0000 mg | ORAL_TABLET | Freq: Every day | ORAL | Status: DC

## 2015-04-08 MED ORDER — BUPROPION HCL ER (SR) 150 MG PO TB12: 150.0000 mg | ORAL_TABLET | Freq: Two times a day (BID) | ORAL | Status: DC

## 2015-04-08 MED ORDER — TRAMADOL HCL 50 MG PO TABS: 50.0000 mg | ORAL_TABLET | Freq: Two times a day (BID) | ORAL | Status: DC | PRN

## 2015-04-08 NOTE — Patient Instructions (Signed)

## 2015-04-08 NOTE — Progress Notes (Signed)
Patient ID: Ernest Myers, male   DOB: 1971-07-29, 44 y.o.   MRN: [id number]  CC: arm pain, leg pain  HPI: Ernest Myers is a 44 y.o. male here today for a follow up visit. Patient has past medical history of hypertension, arthritis, and fatty liver.Patient here with complaints of right sided upper shin pain/swelling that goes up into right lower back. The pain is aggravated by walking. Nothing relieves his pain. The pain is sharp and radiates down both buttocks. No bowel or bladder dysfunction. Patient also c/o right arm pain/swelling that starts at his wrist and goes up to his shoulder. Patient states this has been "hurting everyday for years," and rates as 10/10 with no relief. Patient also states swelling in upper abdomen that he believes is from fluid in his lungs. Patient requests refills on all medications, but states he does not have any money today to pick them up. Patient reports that he is applying for disability due to his sleep apnea and right knee pain.   Patient has No headache, No chest pain, No abdominal pain - No Nausea, No new weakness tingling or numbness, No Cough - SOB.  No Known Allergies Past Medical History  Diagnosis Date  . Hypertension   . Fatty liver   . Arthritis    Current Outpatient Prescriptions on File Prior to Visit  Medication Sig Dispense Refill  . amLODipine (NORVASC) 10 MG tablet Take 1 tablet (10 mg total) by mouth daily. 30 tablet 3  . benazepril (LOTENSIN) 20 MG tablet Take 1 tablet (20 mg total) by mouth daily. 30 tablet 2  . meloxicam (MOBIC) 7.5 MG tablet Take 1 tablet (7.5 mg total) by mouth daily. (Patient not taking: Reported on 04/08/2015) 30 tablet 1  . omeprazole (PRILOSEC) 20 MG capsule Take 1 capsule (20 mg total) by mouth daily. (Patient not taking: Reported on 04/08/2015) 30 capsule 3  . traMADol (ULTRAM) 50 MG tablet Take 1 tablet (50 mg total) by mouth every 12 (twelve) hours as needed. (Patient not taking: Reported on 01/24/2015) 60 tablet  0   No current facility-administered medications on file prior to visit.   Family History  Problem Relation Age of Onset  . Diabetes Mother   . Heart disease Father    History   Social History  . Marital Status: Single    Spouse Name: N/A  . Number of Children: N/A  . Years of Education: N/A   Occupational History  . Not on file.   Social History Main Topics  . Smoking status: Never Smoker   . Smokeless tobacco: Never Used  . Alcohol Use: Yes     Comment: social  . Drug Use: No  . Sexual Activity: Not on file   Other Topics Concern  . Not on file   Social History Narrative    Review of Systems: See HPI.    Objective:   Filed Vitals:   04/08/15 1051  BP: 143/85  Pulse: 85  Temp: 98.2 F (36.8 C)  Resp: 18    Physical Exam  Constitutional:  severely obese   Cardiovascular: Normal rate, regular rhythm and normal heart sounds.   Pulmonary/Chest: Effort normal and breath sounds normal.  Abdominal: There is no tenderness. A hernia (? hard to confirm with body habitus ) is present.  Musculoskeletal: Normal range of motion. He exhibits no edema or tenderness.       Right wrist: He exhibits swelling. He exhibits normal range of motion and no tenderness.  Right knee: He exhibits swelling. He exhibits normal range of motion and no erythema.  Skin: Skin is warm and dry.     Lab Results  Component Value Date   WBC 9.2 08/13/2014   HGB 16.5 08/13/2014   HCT 47.3 08/13/2014   MCV 99.2 08/13/2014   PLT 119* 08/13/2014   Lab Results  Component Value Date   CREATININE 0.76 12/06/2014   BUN 11 12/06/2014   NA 137 12/06/2014   K 4.5 12/06/2014   CL 100 12/06/2014   CO2 28 12/06/2014    Lab Results  Component Value Date   HGBA1C 5.8* 12/06/2014   Lipid Panel     Component Value Date/Time   CHOL 162 12/06/2014 0910   TRIG 78 12/06/2014 0910   HDL 28* 12/06/2014 0910   CHOLHDL 5.8 12/06/2014 0910   VLDL 16 12/06/2014 0910   LDLCALC 118*  12/06/2014 0910       Assessment and plan:   Ernest Myers was seen today for arm pain and leg pain.  Diagnoses and all orders for this visit:  Obesity, morbid, BMI 50 or higher I have again addressed weight with patient. I have went over a number of complications related to obesity and he verbalized understanding. He does not exercise and has no plans to begin due to pain.  Patient depressed   Essential hypertension Orders: -    Refill amLODipine (NORVASC) 10 MG tablet; Take 1 tablet (10 mg total) by mouth daily. -     Refill benazepril (LOTENSIN) 20 MG tablet; Take 1 tablet (20 mg total) by mouth daily. Patient BP is elevated today due to being out of medication.   Depression Orders: -    Begin buPROPion (WELLBUTRIN SR) 150 MG 12 hr tablet; Take 1 tablet (150 mg total) by mouth 2 (two) times daily. Patient PHQ-9 score was elevated and he admits to having thoughts of killing himself over the past 2 weeks. He denies SI or plan today. He reports that he has lost his food stamps due to not working. He states that he has no help at this time  I will try him on Wellbutrin and follow up in 4 weeks. Patient information given to [facility]. LCSW and we sat and developed a safety plan in the vent that he has SI again. Information to family Services given to patient as well.   Right knee pain Orders: -     Ambulatory referral to Orthopedic Surgery -     traMADol (ULTRAM) 50 MG tablet; Take 1 tablet (50 mg total) by mouth every 12 (twelve) hours as needed. Explained thay knee pain may improve with significant weight loss  Right arm pain Orders: -     Ambulatory referral to Orthopedic Surgery Unsure of etiology of right arm swelling and pain. Does not appear to be carpal tunnel.   Umbilical hernia without obstruction and without gangrene Weight loss. If area becomes more problematic I will refer to general surgery. I have advised that he is at risk for surgical complications with his current  weight.  Return in about 3 months (around 07/09/2015).      Ambrose Finland, NP-C [facility] [phone] 04/08/2015, 11:12 AM

## 2015-04-08 NOTE — Progress Notes (Signed)
Patient here with complaints of right sided upper shin pain/swelling that goes up into right lower back.  Patient also comlpaints of right arm pain/swelling that starts at his wrist and goes up to his shoulder.  Patient states this has been "hurting everyday for years," and rates as 10/10 with no relief.  Patient also states swelling in upper abdomen that he believes is from fluid in his lungs.  Patient requests refills on all medications, but states he does not have any money today to pick them up.

## 2015-04-09 ENCOUNTER — Telehealth: Payer: Self-pay | Admitting: Clinical

## 2015-04-09 NOTE — Telephone Encounter (Signed)
F/u w pt; pt states "I feel better today", and says he will pick his meds up from [facility] pharmacy on 04-10-15

## 2015-04-30 ENCOUNTER — Encounter: Payer: Self-pay | Admitting: Family Medicine

## 2015-04-30 ENCOUNTER — Ambulatory Visit (INDEPENDENT_AMBULATORY_CARE_PROVIDER_SITE_OTHER): Payer: Self-pay | Admitting: Family Medicine

## 2015-04-30 VITALS — BP 148/78 | Ht 71.0 in | Wt >= 6400 oz

## 2015-04-30 DIAGNOSIS — M79601 Pain in right arm: Secondary | ICD-10-CM

## 2015-04-30 MED ORDER — GABAPENTIN 300 MG PO CAPS: ORAL_CAPSULE | ORAL | Status: DC

## 2015-04-30 NOTE — Progress Notes (Signed)
  AJA BOLANDER - 44 y.o. male MRN [id number]  Date of birth: [birth year]  CC: No chief complaint on file.   SUBJECTIVE:   HPI  Mr. Paddock is a 43 year old male with right arm pain/paresthesias x several years that has worsened over the last 6 months: - Aching/stinging pain. Pain radiates from posterior hand back to elbow. Pain is 10 out of 10 - Swelling in forearm occasionally. - Not working. No trauma. Not repetitive activity - No medications. - Takes tramadol for back pain, but this doesn't help his arm pain.  - He denies any neck or shoulder pain. - He feels as though his entire arm is weak. - He denies fevers chills or night sweats. - He is prediabetic  ROS:     14 point review of systems negative other than that in history of present illness.  HISTORY: Past Medical, Surgical, Social, and Family History Reviewed & Updated per EMR.  Pertinent Historical Findings include: Morbid obesity  OBJECTIVE: BP 148/78 mmHg  Ht  (1.803 m)  Wt 400 lb (181.439 kg)  BMI 55.81 kg/m2  Physical Exam  Patient is a obese male in no acute distress, speaking in complete sentences with nonlabored breathing. Neck: Negative spurling's Full neck range of motion Grip strength and sensation normal in bilateral hands Strength good C4 to T1 distribution Asymmetric sensation down right arm compared to left. Patient reports it feels numb. Reflexes normal including triceps, biceps, and brachioradialis. No atrophy.  Patient has full range of motion of right shoulder with internal/external rotation as well as flexion and abduction. Strength is 5 out of 5. Elbow flexion extension strength is 5 out of 5. He does report some pain with resisted elbow flexion. He also has pain with resisted forearm supination/pronation.    Wrist: Inspection normal with no visible erythema or swelling. ROM smooth and normal with good flexion and extension and ulnar/radial deviation that is symmetrical with opposite wrist. He  reports pain with this passive RoM.   Palpation is normal over metacarpals, navicular, lunate, and TFCC; tendons without tenderness/ swelling Strength 5/5 in all directions without pain. Negative Finkelstein, tinel's and phalens.    MEDICATIONS, LABS & OTHER ORDERS: Previous Medications   AMLODIPINE (NORVASC) 10 MG TABLET    Take 1 tablet (10 mg total) by mouth daily.   BENAZEPRIL (LOTENSIN) 20 MG TABLET    Take 1 tablet (20 mg total) by mouth daily.   BUPROPION (WELLBUTRIN SR) 150 MG 12 HR TABLET    Take 1 tablet (150 mg total) by mouth 2 (two) times daily.   MELOXICAM (MOBIC) 7.5 MG TABLET    Take 1 tablet (7.5 mg total) by mouth daily.   OMEPRAZOLE (PRILOSEC) 20 MG CAPSULE    Take 1 capsule (20 mg total) by mouth daily.   TRAMADOL (ULTRAM) 50 MG TABLET    Take 1 tablet (50 mg total) by mouth every 12 (twelve) hours as needed.   Modified Medications   No medications on file   New Prescriptions   No medications on file   Discontinued Medications   No medications on file  No orders of the defined types were placed in this encounter.   ASSESSMENT & PLAN: See problem based charting & AVS for pt instructions.

## 2015-05-01 DIAGNOSIS — M79601 Pain in right arm: Secondary | ICD-10-CM | POA: Insufficient documentation

## 2015-05-01 NOTE — Assessment & Plan Note (Signed)
Patient with years of right arm pain and weakness that has worsened over the last 6 months.  Patient is relatively healthy other than morbid obesity.  No neck pain.  No help with tramadol.  He reports chronic arm pain and weakness (although no weakness or hyperreflexity on exam).  Will initially evaluate neck pathology.   - we will obtain an AP & Lateral of the Neck - We will start him on gabapentin  qHS x 1 week and then increase to  bid.   - f/u in 3 weeks.

## 2015-05-09 ENCOUNTER — Ambulatory Visit
Admission: RE | Admit: 2015-05-09 | Discharge: 2015-05-09 | Disposition: A | Discharge status: Home / Self Care | Payer: No Typology Code available for payment source | Source: Ambulatory Visit | Attending: Family Medicine | Admitting: Family Medicine

## 2015-05-09 DIAGNOSIS — M79601 Pain in right arm: Secondary | ICD-10-CM

## 2015-05-21 ENCOUNTER — Encounter: Payer: Self-pay | Admitting: Sports Medicine

## 2015-05-21 ENCOUNTER — Ambulatory Visit (INDEPENDENT_AMBULATORY_CARE_PROVIDER_SITE_OTHER): Payer: Self-pay | Admitting: Sports Medicine

## 2015-05-21 VITALS — BP 121/89 | HR 74 | Ht 71.0 in | Wt >= 6400 oz

## 2015-05-21 DIAGNOSIS — M79601 Pain in right arm: Secondary | ICD-10-CM

## 2015-05-21 NOTE — Progress Notes (Signed)
   Subjective:    Patient ID: Ernest Myers, male    DOB: 1971/07/12, 44 y.o.   MRN: [id number]  HPI   Patient comes in today for follow-up on diffuse right arm pain and swelling. His pain has resolved. Recent x-rays of his cervical spine did show some loss of the normal cervical lordosis but were otherwise unremarkable. Patient admits that he has yet to start his gabapentin but despite this his symptoms seem to have resolved. He does feel like he still has some mild swelling in his right forearm but it too is improving. He is without complaint today.    Review of Systems     Objective:   Physical Exam  Well-developed, well-nourished. No acute distress. Sitting comfortably in exam room.  Cervical spine: full painless cervical range of motion. No tenderness to palpation. Negative Spurling's.  Right shoulder: Full painless range of motion. No tenderness to palpation. Full strength. No obvious swelling of his forearm distally.   Neurological exam: No focal neurological deficit of either upper extremity.   Cervical spine x-rays are as above       Assessment & Plan:  Resolved right arm pain likely secondary to cervical radiculopathy   Patient symptoms are improving. I did encourage him to go ahead and get his gabapentin just in case he has a recurrence of symptoms. Given his overall improvement I do not think we need further imaging at this time. He will follow-up with [place] as needed.

## 2015-07-11 ENCOUNTER — Encounter: Payer: Self-pay | Admitting: Internal Medicine

## 2015-07-11 ENCOUNTER — Ambulatory Visit: Payer: Self-pay

## 2015-07-11 ENCOUNTER — Ambulatory Visit: Payer: Self-pay | Attending: Internal Medicine | Admitting: Internal Medicine

## 2015-07-11 VITALS — BP 138/88 | HR 74 | Temp 98.0°F | Resp 16 | Ht 71.0 in | Wt 393.6 lb

## 2015-07-11 DIAGNOSIS — Z6841 Body Mass Index (BMI) 40.0 and over, adult: Secondary | ICD-10-CM | POA: Insufficient documentation

## 2015-07-11 DIAGNOSIS — Z79899 Other long term (current) drug therapy: Secondary | ICD-10-CM | POA: Insufficient documentation

## 2015-07-11 DIAGNOSIS — F32A Depression, unspecified: Secondary | ICD-10-CM

## 2015-07-11 DIAGNOSIS — M25561 Pain in right knee: Secondary | ICD-10-CM | POA: Insufficient documentation

## 2015-07-11 DIAGNOSIS — I10 Essential (primary) hypertension: Secondary | ICD-10-CM | POA: Insufficient documentation

## 2015-07-11 DIAGNOSIS — F329 Major depressive disorder, single episode, unspecified: Secondary | ICD-10-CM | POA: Insufficient documentation

## 2015-07-11 LAB — BASIC METABOLIC PANEL
BUN: 13 mg/dL (ref 7–25)
CALCIUM: 9.4 mg/dL (ref 8.6–10.3)
CHLORIDE: 101 mmol/L (ref 98–110)
CO2: 27 mmol/L (ref 20–31)
CREATININE: 0.73 mg/dL (ref 0.60–1.35)
Glucose, Bld: 109 mg/dL — ABNORMAL HIGH (ref 65–99)
Potassium: 4.4 mmol/L (ref 3.5–5.3)
Sodium: 139 mmol/L (ref 135–146)

## 2015-07-11 MED ORDER — BENAZEPRIL HCL 20 MG PO TABS: 20.0000 mg | ORAL_TABLET | Freq: Every day | ORAL | Status: DC

## 2015-07-11 MED ORDER — AMLODIPINE BESYLATE 10 MG PO TABS: 10.0000 mg | ORAL_TABLET | Freq: Every day | ORAL | Status: DC

## 2015-07-11 MED ORDER — TRAMADOL HCL 50 MG PO TABS: 50.0000 mg | ORAL_TABLET | Freq: Two times a day (BID) | ORAL | Status: DC | PRN

## 2015-07-11 MED ORDER — BUPROPION HCL ER (SR) 150 MG PO TB12: 150.0000 mg | ORAL_TABLET | Freq: Two times a day (BID) | ORAL | Status: DC

## 2015-07-11 NOTE — Progress Notes (Signed)
Patient ID: Ernest Myers, male   DOB: 27-Sep-1970, 44 y.o.   MRN: [id number] Subjective:  Ernest Myers is a 44 y.o. male with hypertension, depression, morbid obesity, and knee osteoarthritis. He reports that he has started walking almost everyday and has lost some weight since our last visit. He reports that he has decreased his salt intake and trying to cut back on portion size. He takes his blood pressure medication daily without complications.   Current Outpatient Prescriptions  Medication Sig Dispense Refill  . amLODipine (NORVASC) 10 MG tablet Take 1 tablet (10 mg total) by mouth daily. 30 tablet 3  . benazepril (LOTENSIN) 20 MG tablet Take 1 tablet (20 mg total) by mouth daily. 30 tablet 3  . buPROPion (WELLBUTRIN SR) 150 MG 12 hr tablet Take 1 tablet (150 mg total) by mouth 2 (two) times daily. 60 tablet 2  . traMADol (ULTRAM) 50 MG tablet Take 1 tablet (50 mg total) by mouth every 12 (twelve) hours as needed. 60 tablet 0  . gabapentin (NEURONTIN) 300 MG capsule Take one tab qhs for one week and then one tab BID for 3 weeks 60 capsule 0  . meloxicam (MOBIC) 7.5 MG tablet Take 1 tablet (7.5 mg total) by mouth daily. (Patient not taking: Reported on 04/08/2015) 30 tablet 1  . omeprazole (PRILOSEC) 20 MG capsule Take 1 capsule (20 mg total) by mouth daily. (Patient not taking: Reported on 04/08/2015) 30 capsule 3   No current facility-administered medications for this visit.    Hypertension ROS: taking medications as instructed, no medication side effects noted, no TIA's, no chest pain on exertion, no dyspnea on exertion, no swelling of ankles and no palpitations.  New concerns: none  Objective:  BP 138/88 mmHg  Pulse 74  Temp(Src) 98 F (36.7 C)  Resp 16  Ht  (1.803 m)  Wt 393 lb 9.6 oz (178.536 kg)  BMI 54.92 kg/m2  SpO2 100%  Appearance alert, well appearing, and in no distress, oriented to person, place, and time and overweight. General exam BP noted to be well controlled  today in office, S1, S2 normal, no gallop, no murmur, chest clear, no JVD, no HSM, no edema.  Lab review: most recent lipid panel reviewed, showing LDL result does not yet meet goal, HDL low, encouraged weight loss, BMET drawn today.   Assessment:   Diagnoses and all orders for this visit:  Essential hypertension -     amLODipine (NORVASC) 10 MG tablet; Take 1 tablet (10 mg total) by mouth daily. -     benazepril (LOTENSIN) 20 MG tablet; Take 1 tablet (20 mg total) by mouth daily. -     Basic Metabolic Panel Patient blood pressure is stable and may continue on current medication.  Education on diet, exercise, and modifiable risk factors discussed. Will obtain appropriate labs as needed. Will follow up in 3-6 months.   Morbid obesity due to excess calories (HCC) Weight loss discussed at length and its complications to health.  Patient will loss 10 months by next visit in 3 months.  Diet and exercise discussed as well as calorie intake.  Right knee pain -     traMADol (ULTRAM) 50 MG tablet; Take 1 tablet (50 mg total) by mouth every 12 (twelve) hours as needed. Stable, again stressed weight loss.   Depression -     buPROPion (WELLBUTRIN SR) 150 MG 12 hr tablet; Take 1 tablet (150 mg total) by mouth 2 (two) times daily. Stable, meds  refilled   Return in about 3 months (around 10/11/2015) for Hypertension.  Ambrose FinlandValerie A Keck, NP 07/14/2015 6:36 PM

## 2015-07-11 NOTE — Progress Notes (Signed)
Patient here for follow up on his HTN Patient has refused the flu vaccine

## 2015-07-15 ENCOUNTER — Telehealth: Payer: Self-pay

## 2015-07-15 NOTE — Telephone Encounter (Signed)
-----   Message from Ambrose FinlandValerie A Keck, NP sent at 07/12/2015  7:58 PM EDT ----- Labs are within normal limits

## 2015-07-15 NOTE — Telephone Encounter (Signed)
Spoke with patient and he is aware of his normal labs 

## 2015-07-17 ENCOUNTER — Ambulatory Visit: Payer: No Typology Code available for payment source | Attending: Internal Medicine

## 2015-08-01 ENCOUNTER — Encounter: Payer: No Typology Code available for payment source | Admitting: Pharmacist

## 2015-10-11 ENCOUNTER — Encounter: Payer: Self-pay | Admitting: Internal Medicine

## 2015-10-11 ENCOUNTER — Ambulatory Visit: Payer: Self-pay | Attending: Internal Medicine | Admitting: Internal Medicine

## 2015-10-11 VITALS — BP 156/93 | HR 86 | Temp 98.3°F | Resp 16 | Ht 71.0 in | Wt 391.4 lb

## 2015-10-11 DIAGNOSIS — M179 Osteoarthritis of knee, unspecified: Secondary | ICD-10-CM

## 2015-10-11 DIAGNOSIS — I10 Essential (primary) hypertension: Secondary | ICD-10-CM

## 2015-10-11 DIAGNOSIS — M1711 Unilateral primary osteoarthritis, right knee: Secondary | ICD-10-CM

## 2015-10-11 MED ORDER — AMLODIPINE BESYLATE 10 MG PO TABS: 10.0000 mg | ORAL_TABLET | Freq: Every day | ORAL | Status: DC

## 2015-10-11 MED ORDER — BENAZEPRIL HCL 20 MG PO TABS: 20.0000 mg | ORAL_TABLET | Freq: Every day | ORAL | Status: DC

## 2015-10-11 NOTE — Progress Notes (Signed)
Patient ID: AH BOTT, male   DOB: 1971-06-30, 45 y.o.   MRN: [id number] Subjective:  Ernest Myers is a 45 y.o. male with hypertension, depression, and severe obesity. Patient reports that he has not started a exercise regimen or made diet changes to lose weight. He has continue to have bilateral knee pain. He has not been to Orthopedics  Current Outpatient Prescriptions  Medication Sig Dispense Refill  . amLODipine (NORVASC) 10 MG tablet Take 1 tablet (10 mg total) by mouth daily. 30 tablet 3  . benazepril (LOTENSIN) 20 MG tablet Take 1 tablet (20 mg total) by mouth daily. 30 tablet 3  . buPROPion (WELLBUTRIN SR) 150 MG 12 hr tablet Take 1 tablet (150 mg total) by mouth 2 (two) times daily. 60 tablet 2  . gabapentin (NEURONTIN) 300 MG capsule Take one tab qhs for one week and then one tab BID for 3 weeks 60 capsule 0  . meloxicam (MOBIC) 7.5 MG tablet Take 1 tablet (7.5 mg total) by mouth daily. (Patient not taking: Reported on 04/08/2015) 30 tablet 1  . omeprazole (PRILOSEC) 20 MG capsule Take 1 capsule (20 mg total) by mouth daily. (Patient not taking: Reported on 04/08/2015) 30 capsule 3  . traMADol (ULTRAM) 50 MG tablet Take 1 tablet (50 mg total) by mouth every 12 (twelve) hours as needed. 60 tablet 0   No current facility-administered medications for this visit.    Hypertension ROS: taking medications as instructed, no medication side effects noted, no TIA's, no chest pain on exertion, no dyspnea on exertion, no swelling of ankles and no palpitations.   Objective:  BP 156/93 mmHg  Pulse 86  Temp(Src) 98.3 F (36.8 C)  Resp 16  Ht  (1.803 m)  Wt 391 lb 6.4 oz (177.538 kg)  BMI 54.61 kg/m2  SpO2 100%  Appearance alert, well appearing, and in no distress, oriented to person, place, and time and overweight. General exam BP noted to be borderline elevated today in office though normal at other visits, S1, S2 normal, no gallop, no murmur, chest clear, no JVD, no HSM, no edema.   Lab review: labs are reviewed, up to date and normal.   Assessment:   [name] was seen today for follow-up.  Diagnoses and all orders for this visit:  Essential hypertension -     amLODipine (NORVASC) 10 MG tablet; Take 1 tablet (10 mg total) by mouth daily. -     benazepril (LOTENSIN) 20 MG tablet; Take 1 tablet (20 mg total) by mouth daily. I will not increase medication today because last two visits his pressure was normal. I have stressed weight loss in order to prevent complications. Weight likely contributing to pressure elevation.   Osteoarthritis of right knee, unspecified osteoarthritis type -     Ambulatory referral to Orthopedic Surgery Weight loss advised.   Obesity, morbid, BMI 50 or higher (HCC) See above. We discussed diet changes and simple ways to exercise such as walking up and down street for at least 30 minutes. Explained that he may start slow and build up his tolerance.   Return in about 3 months (around 01/09/2016) for Hypertension.   Ambrose Finland, NP 10/16/2015 6:54 PM

## 2015-10-11 NOTE — Progress Notes (Signed)
Patient here for follow up on his HTN 

## 2015-10-23 ENCOUNTER — Ambulatory Visit (INDEPENDENT_AMBULATORY_CARE_PROVIDER_SITE_OTHER): Payer: Self-pay | Admitting: Sports Medicine

## 2015-10-23 ENCOUNTER — Encounter: Payer: Self-pay | Admitting: Sports Medicine

## 2015-10-23 VITALS — BP 156/92 | Ht 70.0 in | Wt 391.0 lb

## 2015-10-23 DIAGNOSIS — M25561 Pain in right knee: Secondary | ICD-10-CM

## 2015-10-23 DIAGNOSIS — M958 Other specified acquired deformities of musculoskeletal system: Secondary | ICD-10-CM

## 2015-10-23 NOTE — Progress Notes (Signed)
   Subjective:    Patient ID: Ernest Myers, male    DOB: 04/25/1971, 45 y.o.   MRN: [id number]  HPI chief complaint: Right knee pain and swelling  Pleasant 45 year old male comes in today complaining of several years of right knee pain and swelling. He denies any trauma but rather describes an insidious onset of pain and swelling that his become intolerable over the past couple of years. 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 There were mild tricompartmental degenerative changes as well. Over the years, he has experienced locking and catching of the knee as well as instability in addition to his pain and swelling. He has to use a cane to ambulate. He has not had any prior surgeries to his knee. No associated numbness or tingling.  Past medical history reviewed Medications reviewed Allergies reviewed    Review of Systems    as above Objective:   Physical Exam   Morbidly obese. No acute distress. Awake alert and oriented 3.  Right knee: Patient lacks about tender 15 of extension. Flexion is to 90. 1-2+ effusion. He is tender to palpation along the medial joint line. Knee is grossly stable to ligamentous exam. Neurovascularly intact distally. Walking with a significant limp and an ill fitting cane.  X-rays of his right knee from 2010 are as above     Assessment & Plan:  Chronic right knee pain and swelling secondary to large OCD an intra-articular loose body Morbid Obesity  Given the patient's relatively young age I would like to refer the patient to Dr. Roda Shutters to discuss merits of arthroscopic loose body removal of his knee. Although the patient is morbidly obese, he appears to be otherwise healthy other than hypertension and sleep apnea. I've given the patient a set of crutches to help assist him with ambulation until he sees Dr.Xu. Follow-up with me as needed.

## 2015-11-08 ENCOUNTER — Encounter: Payer: Self-pay | Admitting: Clinical

## 2015-11-08 NOTE — Progress Notes (Signed)
Depression screen [facility] 2/9 10/11/2015 05/21/2015 04/30/2015 04/08/2015 07/27/2014  Decreased Interest 3 0 0 3 3  Down, Depressed, Hopeless 3 0 0 2 3  PHQ - 2 Score 6 0 0 5 6  Altered sleeping 3 - - 0 3  Tired, decreased energy 2 - - 3 3  Change in appetite 0 - - 1 3  Feeling bad or failure about yourself  0 - - 3 3  Trouble concentrating 1 - - 3 3  Moving slowly or fidgety/restless 1 - - 1 3  Suicidal thoughts 0 - - 0 1  PHQ-9 Score 13 - - 16 25    GAD 7 : Generalized Anxiety Score 10/11/2015  Nervous, Anxious, on Edge 3  Control/stop worrying 1  Worry too much - different things 3  Trouble relaxing 3  Restless 3  Easily annoyed or irritable 1  Afraid - awful might happen 2  Total GAD 7 Score 16

## 2016-01-10 ENCOUNTER — Encounter: Payer: Self-pay | Admitting: Family Medicine

## 2016-01-10 ENCOUNTER — Encounter: Payer: Self-pay | Admitting: Clinical

## 2016-01-10 ENCOUNTER — Ambulatory Visit: Payer: Self-pay | Attending: Family Medicine | Admitting: Family Medicine

## 2016-01-10 VITALS — BP 134/80 | HR 87 | Temp 98.2°F | Resp 16 | Ht 70.0 in | Wt 385.0 lb

## 2016-01-10 DIAGNOSIS — I10 Essential (primary) hypertension: Secondary | ICD-10-CM | POA: Insufficient documentation

## 2016-01-10 DIAGNOSIS — Z114 Encounter for screening for human immunodeficiency virus [HIV]: Secondary | ICD-10-CM | POA: Insufficient documentation

## 2016-01-10 DIAGNOSIS — M79601 Pain in right arm: Secondary | ICD-10-CM

## 2016-01-10 DIAGNOSIS — G8929 Other chronic pain: Secondary | ICD-10-CM | POA: Insufficient documentation

## 2016-01-10 DIAGNOSIS — M25561 Pain in right knee: Secondary | ICD-10-CM | POA: Insufficient documentation

## 2016-01-10 DIAGNOSIS — Z79899 Other long term (current) drug therapy: Secondary | ICD-10-CM | POA: Insufficient documentation

## 2016-01-10 DIAGNOSIS — M25562 Pain in left knee: Secondary | ICD-10-CM | POA: Insufficient documentation

## 2016-01-10 DIAGNOSIS — R7303 Prediabetes: Secondary | ICD-10-CM | POA: Insufficient documentation

## 2016-01-10 LAB — BASIC METABOLIC PANEL WITH GFR
BUN: 14 mg/dL (ref 7–25)
CHLORIDE: 102 mmol/L (ref 98–110)
CO2: 23 mmol/L (ref 20–31)
CREATININE: 0.76 mg/dL (ref 0.60–1.35)
Calcium: 9.2 mg/dL (ref 8.6–10.3)
GFR, Est African American: >89 mL/min (ref >=60)
Glucose, Bld: 112 mg/dL — ABNORMAL HIGH (ref 65–99)
POTASSIUM: 4.1 mmol/L (ref 3.5–5.3)
SODIUM: 137 mmol/L (ref 135–146)

## 2016-01-10 LAB — POCT GLYCOSYLATED HEMOGLOBIN (HGB A1C): HEMOGLOBIN A1C: 5.6

## 2016-01-10 MED ORDER — BENAZEPRIL HCL 20 MG PO TABS: 20.0000 mg | ORAL_TABLET | Freq: Every day | ORAL | Status: DC

## 2016-01-10 MED ORDER — ACETAMINOPHEN-CODEINE #3 300-30 MG PO TABS: 1.0000 | ORAL_TABLET | Freq: Three times a day (TID) | ORAL | Status: DC | PRN

## 2016-01-10 MED ORDER — AMLODIPINE BESYLATE 10 MG PO TABS: 10.0000 mg | ORAL_TABLET | Freq: Every day | ORAL | Status: DC

## 2016-01-10 MED FILL — BENAZEPRIL HCL 20 MG TABLET: 20 | 30 days supply | Qty: 30 | Fill #0

## 2016-01-10 MED FILL — ?AMLODIPINE BESYLATE 10 MG: 10 | 30 days supply | Qty: 30 | Fill #0

## 2016-01-10 MED FILL — ACETAMINOPHEN/COD #3 TABLET: 300-30 | 30 days supply | Qty: 90 | Fill #0

## 2016-01-10 NOTE — Assessment & Plan Note (Signed)
Chronic in setting of obesity with small effusions. Ortho referral Tylenol #3 for pain control

## 2016-01-10 NOTE — Patient Instructions (Addendum)
Ernest Myers was seen today for hypertension and arm pain.  Diagnoses and all orders for this visit:  Bilateral chronic knee pain -     acetaminophen-codeine (TYLENOL #3) 300-30 MG tablet; Take 1 tablet by mouth every 8 (eight) hours as needed for moderate pain. -     AMB referral to orthopedics  Essential hypertension -     amLODipine (NORVASC) 10 MG tablet; Take 1 tablet (10 mg total) by mouth daily. -     benazepril (LOTENSIN) 20 MG tablet; Take 1 tablet (20 mg total) by mouth daily. -     BASIC METABOLIC PANEL WITH GFR  Screening for HIV (human immunodeficiency virus) -     HIV antibody (with reflex)  Prediabetes -     HgB A1c  call for refill of tylenol #3  F/u in 3 months for HTN  Dr. Armen PickupFunches

## 2016-01-10 NOTE — Progress Notes (Signed)
F/U HTN - taking medication as prescribed  Medicine refills  Pain on rt arm no hx injury  Arm swelling and pain  No suicidal thoughts in the past two weeks  Pain scale #10  No tobacco user

## 2016-01-10 NOTE — Assessment & Plan Note (Signed)
Chronic R forearm pain. Normal exam.  Etiology is not clear, but does not appear to be cervical DJD or radiculopathy  Will treat pain with tylenol #3

## 2016-01-10 NOTE — Progress Notes (Signed)
Subjective:  Patient ID: Ernest Myers, male    DOB: 01/09/1971  Age: 45 y.o. MRN: [id number]  CC: Hypertension and Arm Pain   HPI Ernest ArntCharles C Bogdon morbid obesity, HTN, pre-diabetes, presents for    1. CHRONIC HYPERTENSION  Disease Monitoring  Blood pressure range: not checking   Chest pain: no   Dyspnea: no   Claudication: no   Medication compliance: yes  Medication Side Effects  Lightheadedness: no   Urinary frequency: no     2. R forearm pain: for 5 years. No trauma. No wrist or elbow pain. Pain comes and goes. Tramadol did not help much. OTC NSAID does not help. No neck pain or stiffness. No shoulder pain.   3. B/l knee pain: also for 5 years. R>L. Medial knee swelling. No injury. No redness. Evaluated by Sports Medicine referred to ortho but has not yet been seen as he is uninsured.   Social History  Substance Use Topics  . Smoking status: Never Smoker   . Smokeless tobacco: Never Used  . Alcohol Use: Yes     Comment: social   Outpatient Prescriptions Prior to Visit  Medication Sig Dispense Refill  . amLODipine (NORVASC) 10 MG tablet Take 1 tablet (10 mg total) by mouth daily. 30 tablet 3  . benazepril (LOTENSIN) 20 MG tablet Take 1 tablet (20 mg total) by mouth daily. 30 tablet 3  . buPROPion (WELLBUTRIN SR) 150 MG 12 hr tablet Take 1 tablet (150 mg total) by mouth 2 (two) times daily. 60 tablet 2  . gabapentin (NEURONTIN) 300 MG capsule Take one tab qhs for one week and then one tab BID for 3 weeks 60 capsule 0  . meloxicam (MOBIC) 7.5 MG tablet Take 1 tablet (7.5 mg total) by mouth daily. (Patient not taking: Reported on 04/08/2015) 30 tablet 1  . omeprazole (PRILOSEC) 20 MG capsule Take 1 capsule (20 mg total) by mouth daily. (Patient not taking: Reported on 04/08/2015) 30 capsule 3  . traMADol (ULTRAM) 50 MG tablet Take 1 tablet (50 mg total) by mouth every 12 (twelve) hours as needed. 60 tablet 0   No facility-administered medications prior to visit.     ROS Review of Systems  Constitutional: Negative for fever, chills, fatigue and unexpected weight change.  Eyes: Negative for visual disturbance.  Respiratory: Negative for cough and shortness of breath.   Cardiovascular: Negative for chest pain, palpitations and leg swelling.  Gastrointestinal: Negative for nausea, vomiting, abdominal pain, diarrhea, constipation and blood in stool.  Endocrine: Negative for polydipsia, polyphagia and polyuria.  Musculoskeletal: Positive for myalgias, joint swelling and arthralgias. Negative for back pain, gait problem, neck pain and neck stiffness.  Skin: Negative for rash.  Allergic/Immunologic: Negative for immunocompromised state.  Hematological: Negative for adenopathy. Does not bruise/bleed easily.  Psychiatric/Behavioral: Negative for suicidal ideas, sleep disturbance and dysphoric mood. The patient is not nervous/anxious.     Objective:  BP 134/80 mmHg  Pulse 87  Temp(Src) 98.2 F (36.8 C) (Oral)  Resp 16  Ht 5\' 10"  (1.778 m)  Wt 385 lb (174.635 kg)  BMI 55.24 kg/m2  SpO2 96%  BP/Weight 01/10/2016 10/23/2015 10/11/2015  Systolic BP 134 156 156  Diastolic BP 80 92 93  Wt. (Lbs) 385 391 391.4  BMI 55.24 56.1 54.61    Physical Exam  Constitutional: He appears well-developed and well-nourished. No distress.  Obese   HENT:  Head: Normocephalic and atraumatic.  Neck: Normal range of motion. Neck supple.  Cardiovascular: Normal rate,  regular rhythm, normal heart sounds and intact distal pulses.   Pulmonary/Chest: Effort normal and breath sounds normal.  Musculoskeletal: He exhibits no edema.       Right knee: He exhibits decreased range of motion, swelling and effusion. He exhibits no ecchymosis, no deformity, no laceration, no erythema, normal alignment and no LCL laxity. Tenderness found. Medial joint line tenderness noted. No lateral joint line, no MCL, no LCL and no patellar tendon tenderness noted.       Left knee: He exhibits  swelling, effusion and abnormal patellar mobility. He exhibits normal range of motion, no ecchymosis, no deformity, no laceration, no erythema, normal alignment, no LCL laxity, no bony tenderness, normal meniscus and no MCL laxity. Tenderness found. Medial joint line tenderness noted. No lateral joint line, no MCL, no LCL and no patellar tendon tenderness noted.       Feet:  Neurological: He is alert.  Skin: Skin is warm and dry. No rash noted. No erythema.  Psychiatric: He has a normal mood and affect.   Lab Results  Component Value Date   HGBA1C 5.8* 12/06/2014     Assessment & Plan:   There are no diagnoses linked to this encounter. Kairav was seen today for hypertension and arm pain.  Diagnoses and all orders for this visit:  Bilateral chronic knee pain -     acetaminophen-codeine (TYLENOL #3) 300-30 MG tablet; Take 1 tablet by mouth every 8 (eight) hours as needed for moderate pain. -     AMB referral to orthopedics  Essential hypertension -     amLODipine (NORVASC) 10 MG tablet; Take 1 tablet (10 mg total) by mouth daily. -     benazepril (LOTENSIN) 20 MG tablet; Take 1 tablet (20 mg total) by mouth daily. -     BASIC METABOLIC PANEL WITH GFR  Screening for HIV (human immunodeficiency virus) -     HIV antibody (with reflex)  Prediabetes -     HgB A1c   Meds ordered this encounter  Medications  . acetaminophen-codeine (TYLENOL #3) 300-30 MG tablet    Sig: Take 1 tablet by mouth every 8 (eight) hours as needed for moderate pain.    Dispense:  90 tablet    Refill:  0  . amLODipine (NORVASC) 10 MG tablet    Sig: Take 1 tablet (10 mg total) by mouth daily.    Dispense:  30 tablet    Refill:  11  . benazepril (LOTENSIN) 20 MG tablet    Sig: Take 1 tablet (20 mg total) by mouth daily.    Dispense:  30 tablet    Refill:  11    Follow-up: No Follow-up on file.   Dessa Phi MD

## 2016-01-10 NOTE — Progress Notes (Signed)
[facility] 01/10/2016 10/11/2015 05/21/2015 04/30/2015 04/08/2015  Decreased Interest 0 3 0 0 3  Down, Depressed, Hopeless 1 3 0 0 2  PHQ - 2 Score 1 6 0 0 5  Altered sleeping 3 3 - - 0  Tired, decreased energy 2 2 - - 3  Change in appetite 0 0 - - 1  Feeling bad or failure about yourself  0 0 - - 3  Trouble concentrating 3 1 - - 3  Moving slowly or fidgety/restless 2 1 - - 1  Suicidal thoughts 0 0 - - 0  PHQ-9 Score 11 13 - - 16    GAD 7 : Generalized Anxiety Score 01/10/2016 10/11/2015  Nervous, Anxious, on Edge 1 3  Control/stop worrying 2 1  Worry too much - different things 1 3  Trouble relaxing 3 3  Restless 1 3  Easily annoyed or irritable 2 1  Afraid - awful might happen 2 2  Total GAD 7 Score 12 16

## 2016-01-10 NOTE — Assessment & Plan Note (Signed)
Controlled. Continue current regimen. 

## 2016-01-11 LAB — HIV ANTIBODY (ROUTINE TESTING W REFLEX): HIV: NONREACTIVE

## 2016-01-15 ENCOUNTER — Telehealth: Payer: Self-pay | Admitting: *Deleted

## 2016-01-15 NOTE — Telephone Encounter (Signed)
Date of birth verified by pt Normal lab results given  Pt verbalized understanding  

## 2016-01-15 NOTE — Telephone Encounter (Signed)
-----   Message from Dessa PhiJosalyn Funches, MD sent at 01/13/2016  8:14 AM EDT ----- Screening HIV negative Normal BMP

## 2016-01-18 ENCOUNTER — Encounter (HOSPITAL_COMMUNITY): Payer: Self-pay

## 2016-01-18 ENCOUNTER — Emergency Department (HOSPITAL_COMMUNITY): Payer: Self-pay

## 2016-01-18 ENCOUNTER — Emergency Department (HOSPITAL_COMMUNITY)
Admission: EM | Admit: 2016-01-18 | Discharge: 2016-01-18 | Disposition: A | Discharge status: Home / Self Care | Payer: Self-pay | Attending: Emergency Medicine | Admitting: Emergency Medicine

## 2016-01-18 DIAGNOSIS — I10 Essential (primary) hypertension: Secondary | ICD-10-CM | POA: Insufficient documentation

## 2016-01-18 DIAGNOSIS — R109 Unspecified abdominal pain: Secondary | ICD-10-CM

## 2016-01-18 DIAGNOSIS — N200 Calculus of kidney: Secondary | ICD-10-CM | POA: Insufficient documentation

## 2016-01-18 DIAGNOSIS — Z79899 Other long term (current) drug therapy: Secondary | ICD-10-CM | POA: Insufficient documentation

## 2016-01-18 DIAGNOSIS — M1990 Unspecified osteoarthritis, unspecified site: Secondary | ICD-10-CM | POA: Insufficient documentation

## 2016-01-18 DIAGNOSIS — R112 Nausea with vomiting, unspecified: Secondary | ICD-10-CM | POA: Insufficient documentation

## 2016-01-18 DIAGNOSIS — Z8719 Personal history of other diseases of the digestive system: Secondary | ICD-10-CM | POA: Insufficient documentation

## 2016-01-18 LAB — COMPREHENSIVE METABOLIC PANEL
ALBUMIN: 3.8 g/dL (ref 3.5–5.0)
ALK PHOS: 55 U/L (ref 38–126)
ALT: 41 U/L (ref 17–63)
ANION GAP: 13 (ref 5–15)
AST: 41 U/L (ref 15–41)
BUN: 16 mg/dL (ref 6–20)
CHLORIDE: 100 mmol/L — AB (ref 101–111)
CO2: 23 mmol/L (ref 22–32)
Calcium: 9.8 mg/dL (ref 8.9–10.3)
Creatinine, Ser: 1.26 mg/dL — ABNORMAL HIGH (ref 0.61–1.24)
GFR calc non Af Amer: >60 mL/min (ref >60)
GLUCOSE: 138 mg/dL — AB (ref 65–99)
Potassium: 4.1 mmol/L (ref 3.5–5.1)
SODIUM: 136 mmol/L (ref 135–145)
Total Bilirubin: 0.9 mg/dL (ref 0.3–1.2)
Total Protein: 8.6 g/dL — ABNORMAL HIGH (ref 6.5–8.1)

## 2016-01-18 LAB — URINALYSIS, ROUTINE W REFLEX MICROSCOPIC
Bilirubin Urine: NEGATIVE
Glucose, UA: NEGATIVE mg/dL
Ketones, ur: 15 mg/dL — AB
LEUKOCYTES UA: NEGATIVE
NITRITE: NEGATIVE
PH: 6 (ref 5.0–8.0)
Protein, ur: NEGATIVE mg/dL
SPECIFIC GRAVITY, URINE: 1.024 (ref 1.005–1.030)

## 2016-01-18 LAB — LIPASE, BLOOD: Lipase: 24 U/L (ref 11–51)

## 2016-01-18 LAB — CBC WITH DIFFERENTIAL/PLATELET
BASOS PCT: 1 %
Basophils Absolute: 0.1 10*3/uL (ref 0.0–0.1)
EOS ABS: 0.1 10*3/uL (ref 0.0–0.7)
EOS PCT: 1 %
HCT: 45.4 % (ref 39.0–52.0)
HEMOGLOBIN: 15.6 g/dL (ref 13.0–17.0)
LYMPHS ABS: 1.6 10*3/uL (ref 0.7–4.0)
Lymphocytes Relative: 13 %
MCH: 33.5 pg (ref 26.0–34.0)
MCHC: 34.4 g/dL (ref 30.0–36.0)
MCV: 97.6 fL (ref 78.0–100.0)
Monocytes Absolute: 1.1 10*3/uL — ABNORMAL HIGH (ref 0.1–1.0)
Monocytes Relative: 9 %
NEUTROS PCT: 76 %
Neutro Abs: 9.4 10*3/uL — ABNORMAL HIGH (ref 1.7–7.7)
PLATELETS: 124 10*3/uL — AB (ref 150–400)
RBC: 4.65 MIL/uL (ref 4.22–5.81)
RDW: 13.3 % (ref 11.5–15.5)
WBC: 12.2 10*3/uL — AB (ref 4.0–10.5)

## 2016-01-18 LAB — URINE MICROSCOPIC-ADD ON: BACTERIA UA: NONE SEEN

## 2016-01-18 LAB — I-STAT CG4 LACTIC ACID, ED: LACTIC ACID, VENOUS: 0.83 mmol/L (ref 0.5–2.0)

## 2016-01-18 MED ORDER — SODIUM CHLORIDE 0.9 % IV BOLUS (SEPSIS)
1000.0000 mL | Freq: Once | INTRAVENOUS | Status: AC
  Administered 2016-01-18: 1000 mL via INTRAVENOUS

## 2016-01-18 MED ORDER — OXYCODONE-ACETAMINOPHEN 5-325 MG PO TABS: 1.0000 | ORAL_TABLET | ORAL | Status: DC | PRN

## 2016-01-18 MED ORDER — BENAZEPRIL HCL 20 MG PO TABS
20.0000 mg | ORAL_TABLET | Freq: Every day | ORAL | Status: DC
  Administered 2016-01-18: 20 mg via ORAL
  Filled 2016-01-18: qty 1

## 2016-01-18 MED ORDER — MORPHINE SULFATE (PF) 4 MG/ML IV SOLN
4.0000 mg | Freq: Once | INTRAVENOUS | Status: AC
  Administered 2016-01-18: 4 mg via INTRAVENOUS
  Filled 2016-01-18: qty 1

## 2016-01-18 MED ORDER — ONDANSETRON 4 MG PO TBDP: 4.0000 mg | ORAL_TABLET | Freq: Three times a day (TID) | ORAL | Status: DC | PRN

## 2016-01-18 MED ORDER — KETOROLAC TROMETHAMINE 30 MG/ML IJ SOLN
30.0000 mg | Freq: Once | INTRAMUSCULAR | Status: AC
  Administered 2016-01-18: 30 mg via INTRAVENOUS
  Filled 2016-01-18: qty 1

## 2016-01-18 MED ORDER — ONDANSETRON HCL 4 MG/2ML IJ SOLN
4.0000 mg | Freq: Once | INTRAMUSCULAR | Status: AC
  Administered 2016-01-18: 4 mg via INTRAVENOUS
  Filled 2016-01-18: qty 2

## 2016-01-18 MED ORDER — AMLODIPINE BESYLATE 5 MG PO TABS
10.0000 mg | ORAL_TABLET | Freq: Every day | ORAL | Status: DC
  Administered 2016-01-18: 10 mg via ORAL
  Filled 2016-01-18: qty 2

## 2016-01-18 MED ORDER — NAPROXEN 500 MG PO TABS: 500.0000 mg | ORAL_TABLET | Freq: Two times a day (BID) | ORAL | Status: DC

## 2016-01-18 NOTE — ED Notes (Signed)
Per pt he started having left flank pain around 0100 today. He states that he took tylenol with no relief. Pt states that he vomited one time. Pt denies any diarrhea.

## 2016-01-18 NOTE — Discharge Instructions (Signed)
You have been seen today for flank pain. Your CT scan showed evidence of a kidney stone on the left. This stone is almost to the bladder, which means it has almost passed through the body. The stone appears to be made of calcium oxalate. Refer to the attached literature for dietary guidelines to prevent these types of stones. Follow up with urology as soon as possible. Call the number provided to set up an appointment. Follow up with PCP as needed. Your PCP may help you with getting an appointment with urology. Return to ED should symptoms worsen or he develop additional pain, fever, difficulty urinating, or any other major concerns.

## 2016-01-18 NOTE — ED Provider Notes (Signed)
CSN: [id number]     Arrival date & time 01/18/16  0601 History   None    Chief Complaint  Patient presents with  . Flank Pain     (Consider location/radiation/quality/duration/timing/severity/associated sxs/prior Treatment) HPI   Ernest Myers is a 45 y.o. male, with a history of hypertension, presenting to the ED with left flank and left lower back pain that began suddenly at 1 am this morning. Pt states the pain woke him from a sleep and also endorses one episode of vomiting. Pt has urinated since the pain began without difficulty. Pt rates his pain at 10/10, sharp in nature, radiates from left lower back to the left flank. Pt denies fever/chills, diarrhea/constipation, dysuria/hematuria, abdominal pain, or any other complaints.    Past Medical History  Diagnosis Date  . Hypertension   . Fatty liver   . Arthritis    History reviewed. No pertinent past surgical history. Family History  Problem Relation Age of Onset  . Diabetes Mother   . Heart disease Father    Social History  Substance Use Topics  . Smoking status: Never Smoker   . Smokeless tobacco: Never Used  . Alcohol Use: Yes     Comment: social    Review of Systems  Constitutional: Negative for fever, chills and diaphoresis.  Respiratory: Negative for shortness of breath.   Cardiovascular: Negative for chest pain.  Gastrointestinal: Positive for nausea and vomiting. Negative for abdominal pain, diarrhea, constipation and blood in stool.  Genitourinary: Positive for flank pain. Negative for dysuria and hematuria.  Musculoskeletal: Positive for back pain.  Skin: Negative for color change and pallor.  All other systems reviewed and are negative.     Allergies  Review of patient's allergies indicates no known allergies.  Home Medications   Prior to Admission medications   Medication Sig Start Date End Date Taking? Authorizing Provider  acetaminophen-codeine (TYLENOL #3) 300-30 MG tablet Take 1 tablet by  mouth every 8 (eight) hours as needed for moderate pain. 01/10/16  Yes Josalyn Funches, MD  amLODipine (NORVASC) 10 MG tablet Take 1 tablet (10 mg total) by mouth daily. 01/10/16  Yes Josalyn Funches, MD  benazepril (LOTENSIN) 20 MG tablet Take 1 tablet (20 mg total) by mouth daily. 01/10/16  Yes Josalyn Funches, MD  buPROPion (WELLBUTRIN SR) 150 MG 12 hr tablet Take 1 tablet (150 mg total) by mouth 2 (two) times daily. 07/11/15  Yes Ambrose FinlandValerie A Keck, NP  gabapentin (NEURONTIN) 300 MG capsule Take one tab qhs for one week and then one tab BID for 3 weeks Patient taking differently: Take 300 mg by mouth 2 (two) times daily.  04/30/15  Yes Guinevere ScarletBlake Williams, MD  omeprazole (PRILOSEC) 20 MG capsule Take 1 capsule (20 mg total) by mouth daily. 01/24/15  Yes Ambrose FinlandValerie A Keck, NP  naproxen (NAPROSYN) 500 MG tablet Take 1 tablet (500 mg total) by mouth 2 (two) times daily. 01/18/16   Shawn C Joy, PA-C  ondansetron (ZOFRAN ODT) 4 MG disintegrating tablet Take 1 tablet (4 mg total) by mouth every 8 (eight) hours as needed for nausea or vomiting. 01/18/16   Shawn C Joy, PA-C  oxyCODONE-acetaminophen (PERCOCET/ROXICET) 5-325 MG tablet Take 1-2 tablets by mouth every 4 (four) hours as needed for severe pain. 01/18/16   Shawn C Joy, PA-C   BP 173/88 mmHg  Pulse 94  Temp(Src) 98 F (36.7 C) (Rectal)  Resp 23  Ht 5\' 10"  (1.778 m)  Wt 175.996 kg  BMI 55.67 kg/m2  SpO2 93% Physical Exam  Constitutional: He appears well-developed and well-nourished. No distress.  HENT:  Head: Normocephalic and atraumatic.  Eyes: Conjunctivae are normal. Pupils are equal, round, and reactive to light.  Neck: Neck supple.  Cardiovascular: Normal rate, regular rhythm, normal heart sounds and intact distal pulses.   Pulmonary/Chest: Effort normal and breath sounds normal. No respiratory distress.  Abdominal: Soft. There is tenderness (left flank). There is guarding and CVA tenderness (left).  Musculoskeletal: He exhibits no edema or  tenderness.  Lymphadenopathy:    He has no cervical adenopathy.  Neurological: He is alert.  Skin: Skin is warm and dry. He is not diaphoretic.  Psychiatric: He has a normal mood and affect. His behavior is normal.  Nursing note and vitals reviewed.   ED Course  Procedures (including critical care time) Labs Review Labs Reviewed  CBC WITH DIFFERENTIAL/PLATELET - Abnormal; Notable for the following:    WBC 12.2 (*)    Platelets 124 (*)    Neutro Abs 9.4 (*)    Monocytes Absolute 1.1 (*)    All other components within normal limits  COMPREHENSIVE METABOLIC PANEL - Abnormal; Notable for the following:    Chloride 100 (*)    Glucose, Bld 138 (*)    Creatinine, Ser 1.26 (*)    Total Protein 8.6 (*)    All other components within normal limits  URINALYSIS, ROUTINE W REFLEX MICROSCOPIC (NOT AT [facility]) - Abnormal; Notable for the following:    Hgb urine dipstick TRACE (*)    Ketones, ur 15 (*)    All other components within normal limits  URINE MICROSCOPIC-ADD ON - Abnormal; Notable for the following:    Squamous Epithelial / LPF 0-5 (*)    Crystals CA OXALATE CRYSTALS (*)    All other components within normal limits  URINE CULTURE  LIPASE, BLOOD  I-STAT CG4 LACTIC ACID, ED    Imaging Review Ct Renal Stone Study  01/18/2016  CLINICAL DATA:  Left flank pain with nausea and vomiting for 1 day EXAM: CT ABDOMEN AND PELVIS WITHOUT CONTRAST TECHNIQUE: Multidetector CT imaging of the abdomen and pelvis was performed following the standard protocol without oral or intravenous contrast material administration. COMPARISON:  April 13, 2008 FINDINGS: Lower chest:  Lung bases are clear. Hepatobiliary: There is hepatic steatosis. No focal liver lesions are identified. There is cholelithiasis. Gallbladder wall does not appear appreciably thickened. There is no biliary duct dilatation. Pancreas: There is no pancreatic mass or inflammatory focus. Spleen: No splenic lesions are evident. Adrenals/Urinary  Tract: Right adrenal appears normal. 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 Right kidney shows no evidence of mass or hydronephrosis. There is no right-sided renal or ureteral calculus. On the left, the kidney is diffusely edematous with moderate perinephric fluid and stranding in the perinephric region. No left renal mass is seen. There is mild to moderate hydronephrosis on the left. There is no intrarenal calculus on the left. There is a calculus in the left ureterovesical junction measuring 5 x 4 mm. Note that there is edema surrounding much of the left ureter. No other ureteral calculi are identified. The urinary bladder is midline with wall thickness within normal limits. Stomach/Bowel: There are scattered sigmoid diverticula without diverticulitis. There is no bowel wall or mesenteric thickening. No bowel obstruction. No free air or portal venous air. Vascular/Lymphatic: There is no abdominal aortic aneurysm. No vascular lesions are evident on this noncontrast enhanced study. There is no appreciable  adenopathy in the abdomen or pelvis. Reproductive: Prostate and seminal vesicles appear normal in size and contour. There are a few small prostatic calculi. There is no pelvic mass or pelvic fluid collection. Other: Appendix appears normal. There is no abscess or ascites in the abdomen or pelvis. There is fat in each inguinal ring. Musculoskeletal: There are foci of degenerative change in the lower thoracic and lumbar spine regions. There are no blastic or lytic bone lesions. IMPRESSION: 5 x 4 mm calculus at the left ureterovesical junction with left renal and ureteral edema and moderate perinephric fluid/stranding. Mild to moderate hydronephrosis is noted on the left. No intrarenal calculus is noted on either side. Cholelithiasis. Hepatic steatosis. No bowel obstruction. No abscess. Appendix appears normal. There are scattered sigmoid diverticula without diverticulitis.  Electronically Signed   By: Bretta Bang III M.D.   On: 01/18/2016 07:32   I have personally reviewed and evaluated these images and lab results as part of my medical decision-making.   EKG Interpretation None       Medications  morphine 4 MG/ML injection 4 mg (4 mg Intravenous Given 01/18/16 0722)  ondansetron (ZOFRAN) injection 4 mg (4 mg Intravenous Given 01/18/16 0722)  ketorolac (TORADOL) 30 MG/ML injection 30 mg (30 mg Intravenous Given 01/18/16 0722)  sodium chloride 0.9 % bolus 1,000 mL (0 mLs Intravenous Stopped 01/18/16 0758)   Orders Placed This Encounter  Procedures  . Urine culture  . CT Renal Stone Study  . CBC with Differential  . Comprehensive metabolic panel  . Lipase, blood  . Urinalysis, Routine w reflex microscopic (not at [facility])  . Urine microscopic-add on  . Check Rectal Temperature  . Re-check Vital Signs  . I-Stat CG4 Lactic Acid, ED    MDM   Final diagnoses:  Left flank pain  Kidney stone    Ernest Arnt presents with left flank and lower back pain that can suddenly early this morning.  Findings and plan of care discussed with Tyrone Apple, MD after EDP shift change.   Pt is obviously uncomfortable. Moves around and changes position to try to increase comfort and offer relief. Suspect kidney stone. Hypertension noted, but patient confirms he has not taken his morning blood pressure medications. These medications were ordered for him. CT renal study confirms calculus at ureterovesical junction. Hydronephrosis and stranding present. No infection noted on UA. No signs of infection on the patient's blood work, other than a slightly increased WBC count. No signs of sepsis. Patient to follow up with urology. Strict return precautions given. Patient voiced understanding of these instructions, accepts the plan, and is comfortable with discharge.    Filed Vitals:   01/18/16 0700 01/18/16 0730 01/18/16 0735 01/18/16 0738  BP: 187/88 171/98 171/98 171/98   Pulse: 106 108 103   Temp:      Resp:  25 26   Height:      Weight:      SpO2: 92% 86% 90%    Filed Vitals:   01/18/16 0738 01/18/16 0745 01/18/16 0822 01/18/16 0851  BP: 171/98 179/95  173/88  Pulse:  103  94  Temp:   98 F (36.7 C)   TempSrc:   Rectal   Resp:  28  23  Height:      Weight:      SpO2:  92%  93%      Anselm Pancoast, PA-C 01/18/16 1316  Melene Plan, DO 01/19/16 [phone]

## 2016-01-19 LAB — URINE CULTURE: CULTURE: NO GROWTH

## 2016-01-27 ENCOUNTER — Encounter: Payer: Self-pay | Admitting: Sports Medicine

## 2016-01-27 ENCOUNTER — Ambulatory Visit (INDEPENDENT_AMBULATORY_CARE_PROVIDER_SITE_OTHER): Payer: Self-pay | Admitting: Sports Medicine

## 2016-01-27 VITALS — BP 152/86 | Ht 70.0 in | Wt 391.0 lb

## 2016-01-27 DIAGNOSIS — M25562 Pain in left knee: Secondary | ICD-10-CM

## 2016-01-27 DIAGNOSIS — M25561 Pain in right knee: Secondary | ICD-10-CM

## 2016-01-27 DIAGNOSIS — M958 Other specified acquired deformities of musculoskeletal system: Secondary | ICD-10-CM

## 2016-01-27 NOTE — Patient Instructions (Signed)
Start taking the Naproxen Sodium given to you at the ER, take for the next 5 days, then take as needed. Wear ACE wrap as needed  Contact us when you have financial assistance and then we can refer you to Dr. Roda ShuttersXu

## 2016-01-27 NOTE — Progress Notes (Signed)
   Subjective:    Patient ID: Ernest Myers, male    DOB: 02/08/1971, 45 y.o.   MRN: [id number]  HPI  Ernest Myers is a 44-y.o. male who presents for follow-up of right knee pain.  Also endorses worsening left knee pain, though he states that his right knee pain remains worse than the left.  All in all, his pain has been present for several years, but he reports worsening of his symptoms since his last visit to our clinic ~2 months ago.  Was referred to Dr. 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 loose body; he has, however, been unable to see Dr. Roda ShuttersXu due to insurance issues.  Associated symptoms include significant right knee swelling and occasional locking/catching of his right knee.  He is currently using a cane to ambulate and occasionally uses crutches at home.  No prior surgeries or significant injuries to either of his knees.  He denies bruising, erythema, or numbness/tingling.   Review of Systems As noted in HPI.    Objective:   Physical Exam  Right knee: Range of motion limited by pain.  1-2+ joint effusion.  Mild tenderness to palpation along the medial joint line.  Knee is grossly stable on ligamentous exam.  Right lower extremity is neurovascularly intact. Left knee: Range of motion limited by pain.  No apparent joint effusion.  Mild tenderness to palpation along medial joint line.  Knee is grossly stable on ligamentous exam.  Left lower extremity is neurovascularly intact. Gait: Antalgic gait; ambulating with cane.     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 Roda ShuttersXu at this time given that his financial letter has expired; he is currently working to get this renewed so that he can reschedule with Dr. Roda ShuttersXu.  Advised to use  Naproxen sodium for pain relief.  Patient was also supplied ACE bandages to wear for bilateral knee compression in hopes of reducing some of his swelling and inflammation.  He may continue to use his cane and/or crutches to assist with ambulation.  Patient was advised to call for re-referral to Dr. Roda ShuttersXu once he has obtained his new financial letter. We did offer him cortisone injections today, but he refused.  Ernest Myers, M.D.  Patient seen and evaluated with the resident. I agree with the above plan of care. We will refer the patient back over to Dr. Roda ShuttersXu once he has obtained financial assistance. Follow-up with me as needed.

## 2016-03-26 ENCOUNTER — Encounter: Payer: Self-pay | Admitting: Family Medicine

## 2016-03-26 ENCOUNTER — Ambulatory Visit: Payer: Self-pay | Attending: Family Medicine | Admitting: Family Medicine

## 2016-03-26 ENCOUNTER — Ambulatory Visit: Payer: Self-pay

## 2016-03-26 VITALS — BP 124/80 | HR 82 | Temp 98.7°F | Resp 20 | Ht 70.0 in | Wt 389.0 lb

## 2016-03-26 DIAGNOSIS — M545 Low back pain, unspecified: Secondary | ICD-10-CM

## 2016-03-26 DIAGNOSIS — K219 Gastro-esophageal reflux disease without esophagitis: Secondary | ICD-10-CM

## 2016-03-26 DIAGNOSIS — F329 Major depressive disorder, single episode, unspecified: Secondary | ICD-10-CM

## 2016-03-26 DIAGNOSIS — M79601 Pain in right arm: Secondary | ICD-10-CM

## 2016-03-26 DIAGNOSIS — M25561 Pain in right knee: Secondary | ICD-10-CM

## 2016-03-26 DIAGNOSIS — Z6841 Body Mass Index (BMI) 40.0 and over, adult: Secondary | ICD-10-CM | POA: Insufficient documentation

## 2016-03-26 DIAGNOSIS — I10 Essential (primary) hypertension: Secondary | ICD-10-CM

## 2016-03-26 DIAGNOSIS — Z79899 Other long term (current) drug therapy: Secondary | ICD-10-CM | POA: Insufficient documentation

## 2016-03-26 DIAGNOSIS — F32A Depression, unspecified: Secondary | ICD-10-CM | POA: Insufficient documentation

## 2016-03-26 DIAGNOSIS — G8929 Other chronic pain: Secondary | ICD-10-CM

## 2016-03-26 DIAGNOSIS — M25562 Pain in left knee: Secondary | ICD-10-CM

## 2016-03-26 MED ORDER — ACETAMINOPHEN-CODEINE #3 300-30 MG PO TABS: 1.0000 | ORAL_TABLET | Freq: Three times a day (TID) | ORAL | Status: DC | PRN

## 2016-03-26 MED ORDER — AMLODIPINE BESYLATE 10 MG PO TABS: 10.0000 mg | ORAL_TABLET | Freq: Every day | ORAL | Status: DC

## 2016-03-26 MED ORDER — BUPROPION HCL ER (SR) 150 MG PO TB12: 150.0000 mg | ORAL_TABLET | Freq: Two times a day (BID) | ORAL | Status: DC

## 2016-03-26 MED ORDER — NAPROXEN 500 MG PO TABS: 500.0000 mg | ORAL_TABLET | Freq: Two times a day (BID) | ORAL | Status: DC | PRN

## 2016-03-26 MED ORDER — GABAPENTIN 300 MG PO CAPS: 300.0000 mg | ORAL_CAPSULE | Freq: Two times a day (BID) | ORAL | Status: DC

## 2016-03-26 MED ORDER — OMEPRAZOLE 20 MG PO CPDR: 20.0000 mg | DELAYED_RELEASE_CAPSULE | Freq: Every day | ORAL | Status: DC

## 2016-03-26 MED FILL — GABAPENTIN 300 MG CAPSULE: 300 | 30 days supply | Qty: 60 | Fill #0

## 2016-03-26 MED FILL — ?BUPROPION HCL SR 150 MG TA: 150 | 30 days supply | Qty: 60 | Fill #0

## 2016-03-26 MED FILL — OMEPRAZOLE DR 20 MG CAPSULE: 20 | 30 days supply | Qty: 30 | Fill #0

## 2016-03-26 MED FILL — NAPROXEN 500 MG TABLET: 500 | 30 days supply | Qty: 60 | Fill #0

## 2016-03-26 MED FILL — AMLODIPINE BESYLATE 10 MG T: 10 | 30 days supply | Qty: 30 | Fill #0

## 2016-03-26 NOTE — Assessment & Plan Note (Addendum)
A: HTN  P: norvasc 10 mg daily  lotensin 20 mg daily

## 2016-03-26 NOTE — Progress Notes (Signed)
Subjective:  Patient ID: Ernest Myers, male    DOB: 13-Apr-1971  Age: 45 y.o. MRN: [id number]  CC: Hypertension   HPI Ernest Myers has HTN, obesity, chronic b/l knee pain, back pain he presents for   1. HTN: compliant with regimen. No HA, CP, SOB or edema.  2. Low back pain: chronic. Non radiating. For past 10 years. She had a previous lumbar MRI 10/26/2002:  IMPRESSION 1. 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 2. NEGATIVE FOR DISK HERNIATION OR SIGNIFICANT STENOSIS  He is unemployed. He is not exercising other than a bit of walking sometimes. He take gabapentin for pain. He has tried tylenol #3 in the past with some improvement.    Social History  Substance Use Topics  . Smoking status: Never Smoker   . Smokeless tobacco: Never Used  . Alcohol Use: Yes     Comment: social    Outpatient Prescriptions Prior to Visit  Medication Sig Dispense Refill  . acetaminophen-codeine (TYLENOL #3) 300-30 MG tablet Take 1 tablet by mouth every 8 (eight) hours as needed for moderate pain. 90 tablet 0  . amLODipine (NORVASC) 10 MG tablet Take 1 tablet (10 mg total) by mouth daily. 30 tablet 11  . benazepril (LOTENSIN) 20 MG tablet Take 1 tablet (20 mg total) by mouth daily. 30 tablet 11  . buPROPion (WELLBUTRIN SR) 150 MG 12 hr tablet Take 1 tablet (150 mg total) by mouth 2 (two) times daily. 60 tablet 2  . gabapentin (NEURONTIN) 300 MG capsule Take one tab qhs for one week and then one tab BID for 3 weeks (Patient taking differently: Take 300 mg by mouth 2 (two) times daily. ) 60 capsule 0  . naproxen (NAPROSYN) 500 MG tablet Take 1 tablet (500 mg total) by mouth 2 (two) times daily. 30 tablet 0  . omeprazole (PRILOSEC) 20 MG capsule Take 1 capsule (20 mg total) by mouth daily. (Patient not taking: Reported on 03/26/2016) 30 capsule 3  . ondansetron (ZOFRAN ODT) 4 MG disintegrating tablet Take 1 tablet (4 mg total) by mouth every 8 (eight) hours as needed for nausea or  vomiting. (Patient not taking: Reported on 03/26/2016) 20 tablet 0  . oxyCODONE-acetaminophen (PERCOCET/ROXICET) 5-325 MG tablet Take 1-2 tablets by mouth every 4 (four) hours as needed for severe pain. (Patient not taking: Reported on 03/26/2016) 15 tablet 0   No facility-administered medications prior to visit.    ROS Review of Systems  Constitutional: Negative for fever, chills, fatigue and unexpected weight change.  Eyes: Negative for visual disturbance.  Respiratory: Negative for cough and shortness of breath.   Cardiovascular: Negative for chest pain, palpitations and leg swelling.  Gastrointestinal: Negative for nausea, vomiting, abdominal pain, diarrhea, constipation and blood in stool.  Endocrine: Negative for polydipsia, polyphagia and polyuria.  Musculoskeletal: Positive for myalgias, back pain, joint swelling, arthralgias and neck pain. Negative for gait problem and neck stiffness.  Skin: Negative for rash.  Allergic/Immunologic: Negative for immunocompromised state.  Hematological: Negative for adenopathy. Does not bruise/bleed easily.  Psychiatric/Behavioral: Negative for suicidal ideas, sleep disturbance and dysphoric mood. The patient is not nervous/anxious.     Objective:  BP 124/80 mmHg  Pulse 82  Temp(Src) 98.7 F (37.1 C) (Oral)  Resp 20  Ht 5\' 10"  (1.778 m)  Wt 389 lb (176.449 kg)  BMI 55.82 kg/m2  SpO2 96%  BP/Weight 03/26/2016 01/27/2016 01/18/2016  Systolic BP 124 152 173  Diastolic BP 80 86 88  Wt. (Lbs) 389 391 388  BMI 55.82 56.1 55.67   Physical Exam  Constitutional: He appears well-developed and well-nourished. No distress.  HENT:  Head: Normocephalic and atraumatic.  Neck: Normal range of motion. Neck supple.  Cardiovascular: Normal rate, regular rhythm, normal heart sounds and intact distal pulses.   Pulmonary/Chest: Effort normal and breath sounds normal.  Musculoskeletal: He exhibits no edema.       Lumbar back: He exhibits decreased range of  motion, tenderness, swelling and pain. He exhibits no bony tenderness, no edema, no deformity, no laceration, no spasm and normal pulse.  Neurological: He is alert.  Skin: Skin is warm and dry. No rash noted. No erythema.  Psychiatric: He has a normal mood and affect.   Lab Results  Component Value Date   HGBA1C 5.6 01/10/2016    Assessment & Plan:   There are no diagnoses linked to this encounter. Ernest Myers was seen today for hypertension.  Diagnoses and all orders for this visit:  Bilateral chronic knee pain -     acetaminophen-codeine (TYLENOL #3) 300-30 MG tablet; Take 1 tablet by mouth every 8 (eight) hours as needed for moderate pain. -     naproxen (NAPROSYN) 500 MG tablet; Take 1 tablet (500 mg total) by mouth 2 (two) times daily as needed for moderate pain. Take with food  Depression -     buPROPion (WELLBUTRIN SR) 150 MG 12 hr tablet; Take 1 tablet (150 mg total) by mouth 2 (two) times daily.  Essential hypertension -     amLODipine (NORVASC) 10 MG tablet; Take 1 tablet (10 mg total) by mouth daily.  Right arm pain -     gabapentin (NEURONTIN) 300 MG capsule; Take 1 capsule (300 mg total) by mouth 2 (two) times daily. -     naproxen (NAPROSYN) 500 MG tablet; Take 1 tablet (500 mg total) by mouth 2 (two) times daily as needed for moderate pain. Take with food  Midline low back pain without sciatica -     naproxen (NAPROSYN) 500 MG tablet; Take 1 tablet (500 mg total) by mouth 2 (two) times daily as needed for moderate pain. Take with food -     DG Lumbar Spine 2-3 Views; Future  Obesity, morbid, BMI 50 or higher (HCC)  Gastroesophageal reflux disease without esophagitis -     omeprazole (PRILOSEC) 20 MG capsule; Take 1 capsule (20 mg total) by mouth daily.  Essential hypertension, benign   Meds ordered this encounter  Medications  . acetaminophen-codeine (TYLENOL #3) 300-30 MG tablet    Sig: Take 1 tablet by mouth every 8 (eight) hours as needed for moderate pain.     Dispense:  90 tablet    Refill:  0  . buPROPion (WELLBUTRIN SR) 150 MG 12 hr tablet    Sig: Take 1 tablet (150 mg total) by mouth 2 (two) times daily.    Dispense:  60 tablet    Refill:  5  . amLODipine (NORVASC) 10 MG tablet    Sig: Take 1 tablet (10 mg total) by mouth daily.    Dispense:  30 tablet    Refill:  11  . gabapentin (NEURONTIN) 300 MG capsule    Sig: Take 1 capsule (300 mg total) by mouth 2 (two) times daily.    Dispense:  60 capsule    Refill:  5  . naproxen (NAPROSYN) 500 MG tablet    Sig: Take 1 tablet (500 mg total) by mouth 2 (two) times daily as  needed for moderate pain. Take with food    Dispense:  60 tablet    Refill:  2  . omeprazole (PRILOSEC) 20 MG capsule    Sig: Take 1 capsule (20 mg total) by mouth daily.    Dispense:  30 capsule    Refill:  5    Follow-up: Return in about 3 months (around 06/26/2016) for HTN and chronic low back pain .   Dessa Phi MD

## 2016-03-26 NOTE — Progress Notes (Signed)
Patient is here for FU HTN  Patient complains of lower back pain being present constantly and is scaled currently at a 10. Pain is described as stabbing.  Patient has taken medication today and patient has eaten.

## 2016-03-26 NOTE — Patient Instructions (Addendum)
Ernest Myers was seen today for follow-up.  Diagnoses and all orders for this visit:  Bilateral chronic knee pain -     acetaminophen-codeine (TYLENOL #3) 300-30 MG tablet; Take 1 tablet by mouth every 8 (eight) hours as needed for moderate pain. -     naproxen (NAPROSYN) 500 MG tablet; Take 1 tablet (500 mg total) by mouth 2 (two) times daily as needed for moderate pain. Take with food  Depression -     buPROPion (WELLBUTRIN SR) 150 MG 12 hr tablet; Take 1 tablet (150 mg total) by mouth 2 (two) times daily.  Essential hypertension -     amLODipine (NORVASC) 10 MG tablet; Take 1 tablet (10 mg total) by mouth daily.  Right arm pain -     gabapentin (NEURONTIN) 300 MG capsule; Take 1 capsule (300 mg total) by mouth 2 (two) times daily. -     naproxen (NAPROSYN) 500 MG tablet; Take 1 tablet (500 mg total) by mouth 2 (two) times daily as needed for moderate pain. Take with food  Midline low back pain without sciatica -     naproxen (NAPROSYN) 500 MG tablet; Take 1 tablet (500 mg total) by mouth 2 (two) times daily as needed for moderate pain. Take with food -     DG Lumbar Spine 2-3 Views; Future  Obesity, morbid, BMI 50 or higher (HCC)  Gastroesophageal reflux disease without esophagitis -     omeprazole (PRILOSEC) 20 MG capsule; Take 1 capsule (20 mg total) by mouth daily.   I recommend weight loss to help treat chronic back and knee pain  Apply for scholarship membership at your local [employer], focus on low impact exercises like riding the bike and water exercises.  Also for diet focus on reducing intake of sugar in drinks and foods to help lose weight.   F/u in 3 months for HTN and chronic back and knee pain   Dr. Armen PickupFunches

## 2016-03-26 NOTE — Assessment & Plan Note (Signed)
Chronic low back pain w/o radiculopathy   Plan:  DG lumbar spine  Tylenol #3 Naproxen Weight loss, advised patient apply for [employer] scholarship

## 2016-04-06 ENCOUNTER — Other Ambulatory Visit: Payer: Self-pay | Admitting: *Deleted

## 2016-04-06 DIAGNOSIS — G8929 Other chronic pain: Secondary | ICD-10-CM

## 2016-04-06 DIAGNOSIS — M25562 Pain in left knee: Principal | ICD-10-CM

## 2016-04-06 DIAGNOSIS — M25561 Pain in right knee: Principal | ICD-10-CM

## 2016-04-27 ENCOUNTER — Emergency Department (HOSPITAL_COMMUNITY)
Admission: EM | Admit: 2016-04-27 | Discharge: 2016-04-27 | Discharge status: Against Medical Advice | Payer: Self-pay | Attending: Emergency Medicine | Admitting: Emergency Medicine

## 2016-04-27 ENCOUNTER — Ambulatory Visit (HOSPITAL_COMMUNITY)
Admit: 2016-04-27 | Discharge: 2016-04-27 | Disposition: A | Discharge status: Home / Self Care | Payer: Self-pay | Source: Ambulatory Visit | Attending: Family Medicine | Admitting: Family Medicine

## 2016-04-27 DIAGNOSIS — M47816 Spondylosis without myelopathy or radiculopathy, lumbar region: Secondary | ICD-10-CM | POA: Insufficient documentation

## 2016-04-27 DIAGNOSIS — M545 Low back pain, unspecified: Secondary | ICD-10-CM

## 2016-05-06 ENCOUNTER — Telehealth: Payer: Self-pay

## 2016-05-06 NOTE — Telephone Encounter (Signed)
Patient was informed of results 

## 2016-06-01 ENCOUNTER — Encounter: Payer: Self-pay | Admitting: Dietician

## 2016-06-01 ENCOUNTER — Encounter: Payer: Self-pay | Attending: Orthopaedic Surgery | Admitting: Dietician

## 2016-06-01 DIAGNOSIS — I10 Essential (primary) hypertension: Secondary | ICD-10-CM

## 2016-06-01 DIAGNOSIS — E669 Obesity, unspecified: Secondary | ICD-10-CM | POA: Insufficient documentation

## 2016-06-01 DIAGNOSIS — K760 Fatty (change of) liver, not elsewhere classified: Secondary | ICD-10-CM

## 2016-06-01 DIAGNOSIS — E119 Type 2 diabetes mellitus without complications: Secondary | ICD-10-CM | POA: Insufficient documentation

## 2016-06-01 DIAGNOSIS — Z6841 Body Mass Index (BMI) 40.0 and over, adult: Secondary | ICD-10-CM | POA: Insufficient documentation

## 2016-06-01 NOTE — Progress Notes (Signed)
  Medical Nutrition Therapy:  Appt start time: 0810 end time:  0910.   Assessment:  Primary concerns today: Patient is here today alone.  He has a referral for type 2 diabetes, obesity, HTN, and fatty liver.  He has significant knee pain and is not considered a surgical candidate due to his weight.  He also is having a lot of back pain.  Current labs show increased blood sugar readings at times with A1C between 5.6% and 5.8% between 2016 and 2017.  He is having significant depression most days and states that the medication is not helping.  Encouraged patient to call is doctor about this and provided him with the crisis line card.  He denies that he has diabetes.  Weight hx: Highest adult weight 400 lbs 3 months ago He lost to 385 lbs 8/17 and has gained to 391 lbs today. Lowest adult weight 185 lbs 45 yo  Patient lives with his mother.  She does the shopping and cooking.  She has diabetes as well.  He is currently working to get disability.  Preferred Learning Style:   No preference indicated   Learning Readiness:   Considering  MEDICATIONS: see list   DIETARY INTAKE:  Usual eating pattern includes 2 meals and 0 snacks per day. Everyday foods include fast food or other out to eat 3 times weekly.   24-hr recall:  B (8 AM):  1-2 Sausage, 1-2 egg, white toast, cheese, fruit at times Snk ( AM): none  L ( PM): skips Snk (4 PM):  D ( PM): chicken sandwich made at home Snk ( PM): none Beverages: coke, pepsi, sprite, Mt. Dew (1 can daily), water  Usual physical activity: none due to back and knee pain  Estimated energy needs: 2000 calories 225 g carbohydrates 150 g protein 56 g fat  Progress Towards Goal(s):  In progress.   Nutritional Diagnosis:  NB-1.1 Food and nutrition-related knowledge deficit As related to balance of carbohydrate, protein, and fat.  As evidenced by diet hx.    Intervention:  Nutrition counseling and diabetes education initiated. Discussed Carb Counting  by food group as method of portion control, reading food labels.  Discussed reducing fat and low fat cooking methods.  Importance of increased water intake.  Discussed benefits of increased activity, understanding physical limits.  There is no option for a pool. Discussed to bring his mother to the next visit.  . Drink more water.  Aim for about 5 bottles per day or more if you are thirsty.  Avoid soda. Avoid skipping meals.  Breakfast, lunch, dinner daily. Small meals are fine. Eat slowly and stop when you are full. Increase your vegetable intake.  Half your plate should be non starchy vegetables. Meat portion the size of your palm.  Teaching Method Utilized:  Visual Auditory Hands on  Handouts given during visit include:  My plate  Label reading  Meal plan card  Snack list  Barriers to learning/adherence to lifestyle change: decreased reading and vision to read, depression, pain  Demonstrated degree of understanding via:  Teach Back   Monitoring/Evaluation:  Dietary intake, exercise, label reading, and body weight in 2 month(s).

## 2016-06-01 NOTE — Patient Instructions (Signed)
Drink more water.  Aim for about 5 bottles per day or more if you are thirsty.  Avoid soda. Avoid skipping meals.  Breakfast, lunch, dinner daily. Small meals are fine. Eat slowly and stop when you are full. Increase your vegetable intake.  Half your plate should be non starchy vegetables. Meat portion the size of your palm.

## 2016-06-29 ENCOUNTER — Encounter: Payer: Self-pay | Admitting: Licensed Clinical Social Worker

## 2016-06-29 ENCOUNTER — Ambulatory Visit: Payer: Self-pay | Attending: Family Medicine | Admitting: Family Medicine

## 2016-06-29 ENCOUNTER — Encounter: Payer: Self-pay | Admitting: Family Medicine

## 2016-06-29 VITALS — BP 150/97 | HR 77 | Temp 98.3°F | Ht 70.0 in | Wt 391.8 lb

## 2016-06-29 DIAGNOSIS — F331 Major depressive disorder, recurrent, moderate: Secondary | ICD-10-CM

## 2016-06-29 DIAGNOSIS — M25561 Pain in right knee: Secondary | ICD-10-CM

## 2016-06-29 DIAGNOSIS — R05 Cough: Secondary | ICD-10-CM

## 2016-06-29 DIAGNOSIS — I10 Essential (primary) hypertension: Secondary | ICD-10-CM

## 2016-06-29 DIAGNOSIS — R059 Cough, unspecified: Secondary | ICD-10-CM

## 2016-06-29 DIAGNOSIS — G8929 Other chronic pain: Secondary | ICD-10-CM

## 2016-06-29 DIAGNOSIS — M25562 Pain in left knee: Secondary | ICD-10-CM

## 2016-06-29 DIAGNOSIS — M545 Low back pain: Secondary | ICD-10-CM

## 2016-06-29 MED ORDER — TRAMADOL HCL 50 MG PO TABS: 50.0000 mg | ORAL_TABLET | Freq: Three times a day (TID) | ORAL | 0 refills | Status: DC | PRN

## 2016-06-29 MED ORDER — DULOXETINE HCL 20 MG PO CPEP: 20.0000 mg | ORAL_CAPSULE | Freq: Every day | ORAL | 3 refills | Status: DC

## 2016-06-29 MED FILL — traMADol HCL 50 MG TABS: 50 | 30 days supply | Qty: 90 | Fill #0

## 2016-06-29 MED FILL — DULoxetine HCL 20 MG CPEP: 20 | 30 days supply | Qty: 30 | Fill #0

## 2016-06-29 NOTE — Progress Notes (Signed)
Subjective:  Patient ID: Ernest Myers, male    DOB: 20-Feb-1971  Age: 45 y.o. MRN: [id number]  CC: Hypertension   HPI Ernest Myers presents for   1. CHRONIC HYPERTENSION  Disease Monitoring  Blood pressure range: not checking   Chest pain: yes, 2 weeks R posterior with coughing    Dyspnea: no   Claudication: no   Medication compliance: yes  Medication Side Effects  Lightheadedness: no   Urinary frequency: no   Edema: no     2. Cold symptoms: x 1 week. Cough is improving. Has intermittent R sided pains with coughing. No fever or chills. Not taking medication OTC.   3. Depression: has depressed mood and loneliness. He lives with his mother but is often home alone during the day.  He is compliant with Wellbutrin for depression.  He reports that his depression is worsening.    4. Chronic pain in back and knees: He has chronic pain in back and knees. Tylenol #3 did not help with pain. He has seen ortho who is recommending surgical intervention for knee pain but is requiring patient to lose weight prior to surgery. He does not exercise and rarely leaves home. He has gone to work nutrition class.   Social History  Substance Use Topics  . Smoking status: Never Smoker  . Smokeless tobacco: Never Used  . Alcohol use Yes     Comment: social    Outpatient Medications Prior to Visit  Medication Sig Dispense Refill  . acetaminophen-codeine (TYLENOL #3) 300-30 MG tablet Take 1 tablet by mouth every 8 (eight) hours as needed for moderate pain. 90 tablet 0  . amLODipine (NORVASC) 10 MG tablet Take 1 tablet (10 mg total) by mouth daily. 30 tablet 11  . benazepril (LOTENSIN) 20 MG tablet Take 1 tablet (20 mg total) by mouth daily. 30 tablet 11  . buPROPion (WELLBUTRIN SR) 150 MG 12 hr tablet Take 1 tablet (150 mg total) by mouth 2 (two) times daily. 60 tablet 5  . gabapentin (NEURONTIN) 300 MG capsule Take 1 capsule (300 mg total) by mouth 2 (two) times daily. 60 capsule 5  . naproxen  (NAPROSYN) 500 MG tablet Take 1 tablet (500 mg total) by mouth 2 (two) times daily as needed for moderate pain. Take with food 60 tablet 2  . omeprazole (PRILOSEC) 20 MG capsule Take 1 capsule (20 mg total) by mouth daily. 30 capsule 5   No facility-administered medications prior to visit.     ROS Review of Systems  Constitutional: Negative for chills, fatigue, fever and unexpected weight change.  Eyes: Negative for visual disturbance.  Respiratory: Negative for cough and shortness of breath.   Cardiovascular: Negative for chest pain, palpitations and leg swelling.  Gastrointestinal: Negative for abdominal pain, blood in stool, constipation, diarrhea, nausea and vomiting.  Endocrine: Negative for polydipsia, polyphagia and polyuria.  Musculoskeletal: Positive for joint swelling. Negative for arthralgias, back pain, gait problem, myalgias, neck pain and neck stiffness.  Skin: Negative for rash.  Allergic/Immunologic: Negative for immunocompromised state.  Hematological: Negative for adenopathy. Does not bruise/bleed easily.  Psychiatric/Behavioral: Positive for dysphoric mood and suicidal ideas. Negative for sleep disturbance. The patient is not nervous/anxious.     Objective:  BP (!) 160/98 (BP Location: Right Arm, Patient Position: Sitting, Cuff Size: Large)   Pulse 77   Temp 98.3 F (36.8 C) (Oral)   Ht 5\' 10"  (1.778 m)   Wt (!) 391 lb 12.8 oz (177.7 kg)  SpO2 97%   BMI 56.22 kg/m   BP/Weight 06/29/2016 06/01/2016 03/26/2016  Systolic BP 160 - 124  Diastolic BP 98 - 80  Wt. (Lbs) 391.8 391 389  BMI 56.22 56.1 55.82   Wt Readings from Last 3 Encounters:  06/29/16 (!) 391 lb 12.8 oz (177.7 kg)  06/01/16 (!) 391 lb (177.4 kg)  03/26/16 (!) 389 lb (176.4 kg)   BP Readings from Last 3 Encounters:  06/29/16 (!) 160/98  03/26/16 124/80  01/27/16 (!) 152/86    Physical Exam  Constitutional: He appears well-developed and well-nourished. No distress.  Obese   HENT:  Head:  Normocephalic and atraumatic.  Neck: Normal range of motion. Neck supple.  Cardiovascular: Normal rate, regular rhythm, normal heart sounds and intact distal pulses.   Pulmonary/Chest: Effort normal and breath sounds normal.  Musculoskeletal: He exhibits no edema.  Neurological: He is alert.  Skin: Skin is warm and dry. No rash noted. No erythema.  Psychiatric: He has a normal mood and affect.   Lab Results  Component Value Date   HGBA1C 5.6 01/10/2016    Depression screen [facility] 2/9 06/29/2016 06/01/2016 03/26/2016  Decreased Interest 3 3 3   Down, Depressed, Hopeless 3 3 3   PHQ - 2 Score 6 6 6   Altered sleeping 3 0 3  Tired, decreased energy 3 1 3   Change in appetite 3 0 1  Feeling bad or failure about yourself  3 3 3   Trouble concentrating 3 3 3   Moving slowly or fidgety/restless 0 0 3  Suicidal thoughts 1 2 3   PHQ-9 Score 22 15 25   Difficult doing work/chores - Very difficult -   GAD 7 : Generalized Anxiety Score 06/29/2016 03/26/2016 01/10/2016 10/11/2015  Nervous, Anxious, on Edge 3 1 1 3   Control/stop worrying 3 3 2 1   Worry too much - different things 3 3 1 3   Trouble relaxing 3 3 3 3   Restless 3 3 1 3   Easily annoyed or irritable 3 3 2 1   Afraid - awful might happen 3 3 2 2   Total GAD 7 Score 21 19 12 16     Assessment & Plan:   Ernest Myers was seen today for hypertension.  Diagnoses and all orders for this visit:  Bilateral chronic knee pain -     traMADol (ULTRAM) 50 MG tablet; Take 1 tablet (50 mg total) by mouth every 8 (eight) hours as needed. -     DULoxetine (CYMBALTA) 20 MG capsule; Take 1 capsule (20 mg total) by mouth daily.  Chronic midline low back pain without sciatica -     traMADol (ULTRAM) 50 MG tablet; Take 1 tablet (50 mg total) by mouth every 8 (eight) hours as needed. -     DULoxetine (CYMBALTA) 20 MG capsule; Take 1 capsule (20 mg total) by mouth daily.  Cough -     DG Chest 2 View; Future  Moderate episode of recurrent major depressive disorder  (HCC) -     DULoxetine (CYMBALTA) 20 MG capsule; Take 1 capsule (20 mg total) by mouth daily.  Essential hypertension, benign   There are no diagnoses linked to this encounter.  Meds ordered this encounter  Medications  . traMADol (ULTRAM) 50 MG tablet    Sig: Take 1 tablet (50 mg total) by mouth every 8 (eight) hours as needed.    Dispense:  90 tablet    Refill:  0  . DULoxetine (CYMBALTA) 20 MG capsule    Sig: Take 1 capsule (20 mg total)  by mouth daily.    Dispense:  30 capsule    Refill:  3    Follow-up: Return in about 2 weeks (around 07/13/2016) for HTN and depression .   Dessa Phi MD

## 2016-06-29 NOTE — Assessment & Plan Note (Signed)
A: depression declined with SI P: Add Cymbalta  Advised counseling Patient spoke to clinical social worker for resources regarding counseling and suicide prevention

## 2016-06-29 NOTE — Assessment & Plan Note (Signed)
A: chronic pain uncontrolled. No improvement with tylenol #3 P; tramadol for pain control

## 2016-06-29 NOTE — Patient Instructions (Addendum)
Ernest Myers was seen today for hypertension.  Diagnoses and all orders for this visit:  Bilateral chronic knee pain -     traMADol (ULTRAM) 50 MG tablet; Take 1 tablet (50 mg total) by mouth every 8 (eight) hours as needed.  Chronic midline low back pain without sciatica -     traMADol (ULTRAM) 50 MG tablet; Take 1 tablet (50 mg total) by mouth every 8 (eight) hours as needed.  Cough -     DG Chest 2 View; Future   F/u in 2 weeks for BP check and chronic knee and back pain   Dr. Armen PickupFunches

## 2016-06-29 NOTE — Assessment & Plan Note (Signed)
A: chronic pain uncontrolled. No improvement with tylenol #3 P; tramadol for pain control  

## 2016-06-29 NOTE — Assessment & Plan Note (Signed)
BP elevated today Compliant with regimen  Plan: Close f./u for recheck with plan to increase Lotensin to 40 mg daily of high on recheck

## 2016-06-29 NOTE — Progress Notes (Signed)
Pt states that he does not feel safe at home.  Pt declined flu shot.

## 2016-06-29 NOTE — BH Specialist Note (Signed)
Session Start time: 11:13 am   End Time: 11:43 am Total Time:  30 minutes Type of Service: Behavioral Health - Individual/Family Interpreter: No.   Interpreter Name & Language: N/A # [facility]   SUBJECTIVE: Ernest Myers is a 45 y.o. male  Pt. was referred by Dr. Armen PickupFunches for:  depression. Pt. reports the following symptoms/concerns: changes in appetite, overwhelming feelings of sadness, suicidal ideations with no plan  Duration of problem:  "over a year" Severity: Severe Previous treatment: Pt has has hx taking prescribed medication to address mental health   OBJECTIVE: Mood: Depressed & Affect: Tearful Risk of harm to self or others: Pt denied active SI/HI Assessments administered: PHQ-9; GAD-7  LIFE CONTEXT:  Family & Social: Pt's family support consists of mother. Pt reports decrease in social support noting friends no longer visit School/ Work: Pt is unemployed. Pt has applied for Disability three times. Hearing is scheduled for Thursday, 10/19 Self-Care: Pt reports difficulty sleeping and changes in appetite  Life changes: Pt is currently grieving the loss of his father What is important to pt/family (values): Family; Friendship   GOALS ADDRESSED:  Decrease in depression symptoms  INTERVENTIONS: Motivational Interviewing and Supportive   ASSESSMENT:  Pt currently experiencing depression triggered by health concerns and the death of his father. Pt. reports  changes in appetite, overwhelming feelings of sadness, and suicidal ideations with no plan. Pt may benefit from psychoeducation and utilizing healthy coping skills to decrease depression. LCSWA educated pt on the cycle of grief and provided resources on grief support groups through Hospice. Pt disclosed mother is a form of support and did not disclose any information of not feeling safe at home. LCSWA discussed healthy coping skills to decrease symptoms of depression, crisis resources, and the  benefits of initiating counseling services. Pt is open to taking prescribed medication; however, is on the fence about therapy.    PLAN: 1. F/U with behavioral health clinician: Pt was encouraged to contact LCSWA if symptoms worsen or fail to improve [facility] Pt was provided with crisis resources 2. Behavioral Health meds: Wellbutrin, Cymbalta 3. Behavioral recommendations: LCSWA recommends that pt apply healthy coping skills discussed. Pt is encouraged to schedule follow up appointment with LCSWA 4. Referral: Brief Counseling/Psychotherapy, Publishing rights managerCommunity Resource and Supportive Counseling 5. From scale of 1-10, how likely are you to follow plan: 5/10   Ernest Myers, MSW, LCSWA  06/29/16 2:30 pm  Warmhandoff:   Warm Hand Off Completed.

## 2016-08-04 ENCOUNTER — Encounter: Payer: Self-pay | Admitting: Dietician

## 2016-08-04 ENCOUNTER — Encounter: Payer: Self-pay | Attending: Orthopaedic Surgery | Admitting: Dietician

## 2016-08-04 DIAGNOSIS — R7303 Prediabetes: Secondary | ICD-10-CM

## 2016-08-04 DIAGNOSIS — E119 Type 2 diabetes mellitus without complications: Secondary | ICD-10-CM | POA: Insufficient documentation

## 2016-08-04 DIAGNOSIS — Z6841 Body Mass Index (BMI) 40.0 and over, adult: Secondary | ICD-10-CM | POA: Insufficient documentation

## 2016-08-04 DIAGNOSIS — E669 Obesity, unspecified: Secondary | ICD-10-CM | POA: Insufficient documentation

## 2016-08-04 NOTE — Patient Instructions (Addendum)
Consider getting your c-pap/sleep reevaluated Great job on the changes that you have made!  Continue the water rather than the soda.  Continue to eat slowly and stop when you are full.  Continue to eating more vegetables and small portions of meat.    Continue breakfast, lunch, and dinner daily. Continue to walk as able.  Consider inquiring about the [employer] .

## 2016-08-04 NOTE — Progress Notes (Signed)
  Medical Nutrition Therapy:  Appt start time: 0810 end time:  0840.   Assessment:  06/01/16 Primary concerns today: Patient is here today alone.  He has a referral for type 2 diabetes, obesity, HTN, and fatty liver.  He has significant knee pain and is not considered a surgical candidate due to his weight.  He also is having a lot of back pain.  Current labs show increased blood sugar readings at times with A1C between 5.6% and 5.8% between 2016 and 2017.  He is having significant depression most days and states that the medication is not helping.  Encouraged patient to call is doctor about this and provided him with the crisis line card.  He denies that he has diabetes.  Weight hx: Highest adult weight 400 lbs 3 months ago He lost to 385 lbs 8/17 and has gained to 391 lbs today. Lowest adult weight 185 lbs 45 yo  Patient lives with his mother.  She does the shopping and cooking.  She has diabetes as well.  He is currently working to get disability.  08/04/16: Patient is here alone.  He states that the disability was improved and he is feeling better and less depressed because of this.  He continues on the anti-depressants.  He reports stopping the soda and drinking water as well as eating more vegetables and has started to walk a little more. He reports using the c-pap when he sleeps but feels that he sleeps too much.  Preferred Learning Style:   No preference indicated   Learning Readiness:   Considering  MEDICATIONS: see list   DIETARY INTAKE:  Usual eating pattern includes 2 meals and 0 snacks per day. Everyday foods include fast food or other out to eat 3 times weekly.   24-hr recall:  B (8 AM):  1 Sausage, 1 egg, white toast, fruit Snk ( AM): none  L ( PM): bologna sandwich on white with mustard, fruit Snk (4 PM):  D ( PM): salad or chicken and vegetables Snk ( PM): none Beverages: water  Usual physical activity: has started to walk some daily  Estimated energy  needs: 2000 calories 225 g carbohydrates 150 g protein 56 g fat  Progress Towards Goal(s):  In progress.   Nutritional Diagnosis:  NB-1.1 Food and nutrition-related knowledge deficit As related to balance of carbohydrate, protein, and fat.  As evidenced by diet hx.    Intervention:  Nutrition counseling and diabetes education continued. Reviewed about insulin resistance and tips to improve this.  Reviewed complications of diabetes and normal A1C readings.  Reviewed importance of continued exercise as able and continuing changes that he has made.  Consider getting your c-pap/sleep reevaluated Great job on the changes that you have made!  Continue the water rather than the soda.  Continue to eat slowly and stop when you are full.  Continue to eating more vegetables and small portions of meat.    Continue breakfast, lunch, and dinner daily. Continue to walk as able.  Consider inquiring about the [employer] .  Teaching Method Utilized:  Visual Auditory Hands on  Handouts given during initial visit include:  My plate  Label reading  Meal plan card  Snack list  Copies from Living Well with Diabetes  Barriers to learning/adherence to lifestyle change: decreased reading and vision to read, depression, pain  Demonstrated degree of understanding via:  Teach Back   Monitoring/Evaluation:  Dietary intake, exercise, label reading, and body weight prn.

## 2016-09-18 ENCOUNTER — Encounter: Payer: Self-pay | Admitting: Family Medicine

## 2016-09-18 ENCOUNTER — Ambulatory Visit: Payer: Self-pay | Attending: Family Medicine | Admitting: Family Medicine

## 2016-09-18 VITALS — BP 152/97 | HR 78 | Temp 98.2°F | Ht 70.0 in | Wt 387.8 lb

## 2016-09-18 DIAGNOSIS — M545 Low back pain, unspecified: Secondary | ICD-10-CM

## 2016-09-18 DIAGNOSIS — G8929 Other chronic pain: Secondary | ICD-10-CM | POA: Insufficient documentation

## 2016-09-18 DIAGNOSIS — M25562 Pain in left knee: Secondary | ICD-10-CM | POA: Insufficient documentation

## 2016-09-18 DIAGNOSIS — J069 Acute upper respiratory infection, unspecified: Secondary | ICD-10-CM | POA: Insufficient documentation

## 2016-09-18 DIAGNOSIS — I10 Essential (primary) hypertension: Secondary | ICD-10-CM | POA: Insufficient documentation

## 2016-09-18 DIAGNOSIS — R10A3 Flank pain, bilateral: Secondary | ICD-10-CM | POA: Insufficient documentation

## 2016-09-18 DIAGNOSIS — R109 Unspecified abdominal pain: Secondary | ICD-10-CM | POA: Insufficient documentation

## 2016-09-18 DIAGNOSIS — M25561 Pain in right knee: Secondary | ICD-10-CM | POA: Insufficient documentation

## 2016-09-18 MED ORDER — ACETAMINOPHEN-CODEINE #3 300-30 MG PO TABS: 1.0000 | ORAL_TABLET | Freq: Three times a day (TID) | ORAL | 0 refills | Status: DC | PRN

## 2016-09-18 MED ORDER — BENAZEPRIL HCL 40 MG PO TABS: 40.0000 mg | ORAL_TABLET | Freq: Every day | ORAL | 5 refills | Status: DC

## 2016-09-18 MED ORDER — AMOXICILLIN 500 MG PO CAPS: 500.0000 mg | ORAL_CAPSULE | Freq: Three times a day (TID) | ORAL | 0 refills | Status: DC

## 2016-09-18 NOTE — Assessment & Plan Note (Signed)
Elevated BP Increase benazepril to 40 mg daily Continue norvasc 10 mg daily

## 2016-09-18 NOTE — Patient Instructions (Addendum)
Ernest Myers was seen today for hypertension.  Diagnoses and all orders for this visit:  Essential hypertension -     benazepril (LOTENSIN) 40 MG tablet; Take 1 tablet (40 mg total) by mouth daily.  Acute upper respiratory infection -     amoxicillin (AMOXIL) 500 MG capsule; Take 1 capsule (500 mg total) by mouth 3 (three) times daily.  Chronic midline low back pain without sciatica -     acetaminophen-codeine (TYLENOL #3) 300-30 MG tablet; Take 1 tablet by mouth every 8 (eight) hours as needed for moderate pain.  Bilateral flank pain -     DG Chest 2 View; Future    Stop tamadol for chronic back pain Start tylenol #3  F/u in 4 weeks for HTN  Dr. Armen PickupFunches

## 2016-09-18 NOTE — Assessment & Plan Note (Signed)
B/l flank pain in obese patient with chronic pain No red flags Tylenol #3 for pain Chest x-ray

## 2016-09-18 NOTE — Assessment & Plan Note (Signed)
Course of amoxicillin  

## 2016-09-18 NOTE — Progress Notes (Signed)
Subjective:  Patient ID: Ernest Myers, male    DOB: 16-Apr-1971  Age: 46 y.o. MRN: [id number]  CC: Hypertension   HPI TYRESSE JAYSON presents for   1. CHRONIC HYPERTENSION  Disease Monitoring  Blood pressure range: not checking   Chest pain: yes, 2 weeks R posterior with coughing    Dyspnea: no   Claudication: no   Medication compliance: yes  Medication Side Effects  Lightheadedness: no   Urinary frequency: no   Edema: no     2. Cold symptoms: x 1 week. Intermittent cough. Headache. Discharge from eyes. No fever. Some chills. Pain in b/l flanks that is worse with cough and bending to side. No shortness of breath, unilateral leg swelling, dizziness or lightheadedness.   3. Chronic pain in back and knees: He has chronic pain in back and knees. Tylenol #3 did not help with pain. tramadol did not help with pain either.  He has seen ortho who is recommending surgical intervention for knee pain but is requiring patient to lose weight prior to surgery. He does not exercise and rarely leaves home. He has gone to work nutrition class.   Social History  Substance Use Topics  . Smoking status: Never Smoker  . Smokeless tobacco: Never Used  . Alcohol use Yes     Comment: social    Outpatient Medications Prior to Visit  Medication Sig Dispense Refill  . amLODipine (NORVASC) 10 MG tablet Take 1 tablet (10 mg total) by mouth daily. 30 tablet 11  . benazepril (LOTENSIN) 20 MG tablet Take 1 tablet (20 mg total) by mouth daily. 30 tablet 11  . buPROPion (WELLBUTRIN SR) 150 MG 12 hr tablet Take 1 tablet (150 mg total) by mouth 2 (two) times daily. 60 tablet 5  . DULoxetine (CYMBALTA) 20 MG capsule Take 1 capsule (20 mg total) by mouth daily. 30 capsule 3  . gabapentin (NEURONTIN) 300 MG capsule Take 1 capsule (300 mg total) by mouth 2 (two) times daily. 60 capsule 5  . naproxen (NAPROSYN) 500 MG tablet Take 1 tablet (500 mg total) by mouth 2 (two) times daily as needed for moderate pain.  Take with food 60 tablet 2  . omeprazole (PRILOSEC) 20 MG capsule Take 1 capsule (20 mg total) by mouth daily. 30 capsule 5  . traMADol (ULTRAM) 50 MG tablet Take 1 tablet (50 mg total) by mouth every 8 (eight) hours as needed. 90 tablet 0   No facility-administered medications prior to visit.     ROS Review of Systems  Constitutional: Negative for chills, fatigue, fever and unexpected weight change.  Eyes: Positive for discharge and visual disturbance.  Respiratory: Negative for cough and shortness of breath.   Cardiovascular: Positive for chest pain. Negative for palpitations and leg swelling.  Gastrointestinal: Positive for abdominal pain. Negative for blood in stool, constipation, diarrhea, nausea and vomiting.  Endocrine: Negative for polydipsia, polyphagia and polyuria.  Genitourinary: Positive for flank pain.  Musculoskeletal: Positive for joint swelling. Negative for arthralgias, back pain, gait problem, myalgias, neck pain and neck stiffness.  Skin: Negative for rash.  Allergic/Immunologic: Negative for immunocompromised state.  Hematological: Negative for adenopathy. Does not bruise/bleed easily.  Psychiatric/Behavioral: Positive for dysphoric mood and suicidal ideas. Negative for sleep disturbance. The patient is not nervous/anxious.     Objective:  BP (!) 152/97 (BP Location: Left Arm, Patient Position: Sitting, Cuff Size: Large)   Pulse 78   Temp 98.2 F (36.8 C) (Oral)   Ht 5\' 10"  (  1.778 m)   Wt (!) 387 lb 12.8 oz (175.9 kg)   SpO2 95%   BMI 55.64 kg/m   BP/Weight 09/18/2016 08/04/2016 06/29/2016  Systolic BP 152 - 150  Diastolic BP 97 - 97  Wt. (Lbs) 387.8 388 391.8  BMI 55.64 54.12 56.22   Wt Readings from Last 3 Encounters:  08/04/16 (!) 388 lb (176 kg)  06/29/16 (!) 391 lb 12.8 oz (177.7 kg)  06/01/16 (!) 391 lb (177.4 kg)   BP Readings from Last 3 Encounters:  06/29/16 (!) 150/97  03/26/16 124/80  01/27/16 (!) 152/86    Physical Exam    Constitutional: He appears well-developed and well-nourished. No distress.  Obese   HENT:  Head: Normocephalic and atraumatic.  Nose: Nose normal.  Mouth/Throat: Oropharynx is clear and moist.  Neck: Normal range of motion. Neck supple.  Cardiovascular: Normal rate, regular rhythm, normal heart sounds and intact distal pulses.   Pulmonary/Chest: Effort normal and breath sounds normal.  Abdominal:  Obese, non tender, no mass   Musculoskeletal: He exhibits no edema.  Neurological: He is alert.  Skin: Skin is warm and dry. No rash noted. No erythema.  Psychiatric: He has a normal mood and affect.   Lab Results  Component Value Date   HGBA1C 5.6 01/10/2016    [facility] 09/18/2016 08/04/2016 06/29/2016  Decreased Interest 3 3 3   Down, Depressed, Hopeless 1 0 3  PHQ - 2 Score 4 3 6   Altered sleeping 2 3 3   Tired, decreased energy 1 3 3   Change in appetite 1 0 3  Feeling bad or failure about yourself  1 0 3  Trouble concentrating 2 0 3  Moving slowly or fidgety/restless 1 2 0  Suicidal thoughts 1 0 1  PHQ-9 Score 13 11 22   Difficult doing work/chores - Not difficult at all -  Some recent data might be hidden   GAD 7 : Generalized Anxiety Score 09/18/2016 06/29/2016 03/26/2016 01/10/2016  Nervous, Anxious, on Edge 1 3 1 1   Control/stop worrying 1 3 3 2   Worry too much - different things 3 3 3 1   Trouble relaxing 2 3 3 3   Restless 3 3 3 1   Easily annoyed or irritable 2 3 3 2   Afraid - awful might happen 2 3 3 2   Total GAD 7 Score 14 21 19 12     Assessment & Plan:   [name] was seen today for hypertension.  Diagnoses and all orders for this visit:  Essential hypertension -     benazepril (LOTENSIN) 40 MG tablet; Take 1 tablet (40 mg total) by mouth daily.  Acute upper respiratory infection -     amoxicillin (AMOXIL) 500 MG capsule; Take 1 capsule (500 mg total) by mouth 3 (three) times daily.  Chronic midline low back pain without sciatica -      acetaminophen-codeine (TYLENOL #3) 300-30 MG tablet; Take 1 tablet by mouth every 8 (eight) hours as needed for moderate pain.  Bilateral flank pain -     DG Chest 2 View; Future   There are no diagnoses linked to this encounter.  No orders of the defined types were placed in this encounter.   Follow-up: Return in about 4 weeks (around 10/16/2016) for HTN .   Dessa PhiJosalyn Addie Alonge MD

## 2016-10-05 MED FILL — BENAZEPRIL HCL 40 MG TABLET: 40 | 30 days supply | Qty: 30 | Fill #0

## 2016-10-19 ENCOUNTER — Ambulatory Visit: Payer: Self-pay | Attending: Family Medicine | Admitting: Family Medicine

## 2016-10-19 ENCOUNTER — Encounter: Payer: Self-pay | Admitting: Family Medicine

## 2016-10-19 VITALS — BP 148/91 | HR 82 | Temp 97.9°F | Ht 70.0 in | Wt 390.6 lb

## 2016-10-19 DIAGNOSIS — G8929 Other chronic pain: Secondary | ICD-10-CM

## 2016-10-19 DIAGNOSIS — M25562 Pain in left knee: Secondary | ICD-10-CM | POA: Insufficient documentation

## 2016-10-19 DIAGNOSIS — M545 Low back pain, unspecified: Secondary | ICD-10-CM

## 2016-10-19 DIAGNOSIS — G4730 Sleep apnea, unspecified: Secondary | ICD-10-CM

## 2016-10-19 DIAGNOSIS — K219 Gastro-esophageal reflux disease without esophagitis: Secondary | ICD-10-CM

## 2016-10-19 DIAGNOSIS — F331 Major depressive disorder, recurrent, moderate: Secondary | ICD-10-CM

## 2016-10-19 DIAGNOSIS — I10 Essential (primary) hypertension: Secondary | ICD-10-CM

## 2016-10-19 DIAGNOSIS — M25561 Pain in right knee: Secondary | ICD-10-CM | POA: Insufficient documentation

## 2016-10-19 DIAGNOSIS — F320 Major depressive disorder, single episode, mild: Secondary | ICD-10-CM

## 2016-10-19 DIAGNOSIS — M79601 Pain in right arm: Secondary | ICD-10-CM

## 2016-10-19 MED ORDER — OMEPRAZOLE 20 MG PO CPDR: 20.0000 mg | DELAYED_RELEASE_CAPSULE | Freq: Every day | ORAL | 5 refills | Status: DC

## 2016-10-19 MED ORDER — BUPROPION HCL ER (SR) 150 MG PO TB12: 150.0000 mg | ORAL_TABLET | Freq: Two times a day (BID) | ORAL | 5 refills | Status: DC

## 2016-10-19 MED ORDER — DULOXETINE HCL 20 MG PO CPEP: 20.0000 mg | ORAL_CAPSULE | Freq: Every day | ORAL | 5 refills | Status: DC

## 2016-10-19 MED ORDER — NAPROXEN 500 MG PO TABS: 500.0000 mg | ORAL_TABLET | Freq: Two times a day (BID) | ORAL | 5 refills | Status: DC | PRN

## 2016-10-19 MED ORDER — ACETAMINOPHEN-CODEINE #3 300-30 MG PO TABS: 1.0000 | ORAL_TABLET | Freq: Three times a day (TID) | ORAL | 2 refills | Status: DC | PRN

## 2016-10-19 MED ORDER — GABAPENTIN 300 MG PO CAPS: 300.0000 mg | ORAL_CAPSULE | Freq: Two times a day (BID) | ORAL | 5 refills | Status: DC

## 2016-10-19 MED ORDER — CHLORTHALIDONE 25 MG PO TABS: 25.0000 mg | ORAL_TABLET | Freq: Every day | ORAL | 5 refills | Status: DC

## 2016-10-19 NOTE — Assessment & Plan Note (Signed)
Chronic sleep apnea with daytime somnolence Reported compliance with CPAP Plan: Letter written to excuse patient from jury duty

## 2016-10-19 NOTE — Assessment & Plan Note (Signed)
Improved but still a bit above goal Plan: Add chlorthalidone 25 mg daily to regimen

## 2016-10-19 NOTE — Patient Instructions (Addendum)
Ernest Myers was seen today for hypertension.  Diagnoses and all orders for this visit:  Essential hypertension, benign -     chlorthalidone (HYGROTON) 25 MG tablet; Take 1 tablet (25 mg total) by mouth daily.  Bilateral chronic knee pain -     DULoxetine (CYMBALTA) 20 MG capsule; Take 1 capsule (20 mg total) by mouth daily. -     naproxen (NAPROSYN) 500 MG tablet; Take 1 tablet (500 mg total) by mouth 2 (two) times daily as needed for moderate pain. Take with food  Chronic midline low back pain without sciatica -     DULoxetine (CYMBALTA) 20 MG capsule; Take 1 capsule (20 mg total) by mouth daily. -     naproxen (NAPROSYN) 500 MG tablet; Take 1 tablet (500 mg total) by mouth 2 (two) times daily as needed for moderate pain. Take with food -     acetaminophen-codeine (TYLENOL #3) 300-30 MG tablet; Take 1 tablet by mouth every 8 (eight) hours as needed for moderate pain.  Moderate episode of recurrent major depressive disorder (HCC) -     DULoxetine (CYMBALTA) 20 MG capsule; Take 1 capsule (20 mg total) by mouth daily.  Mild single current episode of major depressive disorder (HCC) -     buPROPion (WELLBUTRIN SR) 150 MG 12 hr tablet; Take 1 tablet (150 mg total) by mouth 2 (two) times daily.  Right arm pain -     naproxen (NAPROSYN) 500 MG tablet; Take 1 tablet (500 mg total) by mouth 2 (two) times daily as needed for moderate pain. Take with food -     gabapentin (NEURONTIN) 300 MG capsule; Take 1 capsule (300 mg total) by mouth 2 (two) times daily.  Gastroesophageal reflux disease without esophagitis -     omeprazole (PRILOSEC) 20 MG capsule; Take 1 capsule (20 mg total) by mouth daily.    2-3 weeks for HTN, BP check with pharmacist  F/u in 3 months with me for HTN  Dr. Armen PickupFunches

## 2016-10-19 NOTE — Progress Notes (Signed)
Subjective:  Patient ID: Ernest Myers, male    DOB: [birth year]  Age: 46 y.o. MRN: [id number]  CC: Hypertension   HPI Ernest Myers presents for   1. CHRONIC HYPERTENSION  Disease Monitoring  Blood pressure range: not checking   Chest pain: no   Dyspnea: no   Claudication: no   Medication compliance: yes  Medication Side Effects  Lightheadedness: no   Urinary frequency: no   Edema: yes in L lower leg      2. Jury duty: he has been summoned to jury duty. He request excusal letter. He reports that due to his sleep apnea he has daytime fatigue. He falls asleep easily. He reports he falls asleep about 3 times per day.   Social History  Substance Use Topics  . Smoking status: Never Smoker  . Smokeless tobacco: Never Used  . Alcohol use Yes     Comment: social    Outpatient Medications Prior to Visit  Medication Sig Dispense Refill  . acetaminophen-codeine (TYLENOL #3) 300-30 MG tablet Take 1 tablet by mouth every 8 (eight) hours as needed for moderate pain. 60 tablet 0  . amLODipine (NORVASC) 10 MG tablet Take 1 tablet (10 mg total) by mouth daily. 30 tablet 11  . amoxicillin (AMOXIL) 500 MG capsule Take 1 capsule (500 mg total) by mouth 3 (three) times daily. 30 capsule 0  . benazepril (LOTENSIN) 40 MG tablet Take 1 tablet (40 mg total) by mouth daily. 30 tablet 5  . buPROPion (WELLBUTRIN SR) 150 MG 12 hr tablet Take 1 tablet (150 mg total) by mouth 2 (two) times daily. 60 tablet 5  . DULoxetine (CYMBALTA) 20 MG capsule Take 1 capsule (20 mg total) by mouth daily. 30 capsule 3  . gabapentin (NEURONTIN) 300 MG capsule Take 1 capsule (300 mg total) by mouth 2 (two) times daily. 60 capsule 5  . naproxen (NAPROSYN) 500 MG tablet Take 1 tablet (500 mg total) by mouth 2 (two) times daily as needed for moderate pain. Take with food 60 tablet 2  . omeprazole (PRILOSEC) 20 MG capsule Take 1 capsule (20 mg total) by mouth daily. 30 capsule 5   No facility-administered medications  prior to visit.     ROS Review of Systems  Constitutional: Positive for fatigue. Negative for chills, fever and unexpected weight change.  Eyes: Negative for discharge and visual disturbance.  Respiratory: Negative for cough and shortness of breath.   Cardiovascular: Negative for chest pain, palpitations and leg swelling.  Gastrointestinal: Negative for abdominal pain, anal bleeding, blood in stool, constipation, diarrhea, nausea and vomiting.  Endocrine: Negative for polydipsia, polyphagia and polyuria.  Genitourinary: Negative for flank pain.  Musculoskeletal: Positive for arthralgias and joint swelling. Negative for back pain, gait problem, myalgias, neck pain and neck stiffness.  Skin: Negative for rash.  Allergic/Immunologic: Negative for immunocompromised state.  Hematological: Negative for adenopathy. Does not bruise/bleed easily.  Psychiatric/Behavioral: Positive for dysphoric mood. Negative for sleep disturbance and suicidal ideas. The patient is not nervous/anxious.     Objective:  BP (!) 148/91 (BP Location: Right Arm, Patient Position: Sitting, Cuff Size: Large)   Pulse 82   Temp 97.9 F (36.6 C) (Oral)   Ht 5\' 10"  (1.778 m)   Wt (!) 390 lb 9.6 oz (177.2 kg)   SpO2 97%   BMI 56.05 kg/m   BP/Weight 10/19/2016 09/18/2016 08/04/2016  Systolic BP 148 152 -  Diastolic BP 91 97 -  Wt. (Lbs) 390.6 387.8 388  BMI 56.05 55.64 54.12   Wt Readings from Last 3 Encounters:  10/19/16 (!) 390 lb 9.6 oz (177.2 kg)  09/18/16 (!) 387 lb 12.8 oz (175.9 kg)  08/04/16 (!) 388 lb (176 kg)   BP Readings from Last 3 Encounters:  10/19/16 (!) 148/91  09/18/16 (!) 152/97  06/29/16 (!) 150/97    Physical Exam  Constitutional: He appears well-developed and well-nourished. No distress.  Obese   HENT:  Head: Normocephalic and atraumatic.  Nose: Nose normal.  Mouth/Throat: Oropharynx is clear and moist.  Neck: Normal range of motion. Neck supple.  Cardiovascular: Normal rate, regular  rhythm, normal heart sounds and intact distal pulses.   Pulmonary/Chest: Effort normal and breath sounds normal.  Musculoskeletal: He exhibits no edema.  Neurological: He is alert.  Skin: Skin is warm and dry. No rash noted. No erythema.  Psychiatric: He has a normal mood and affect.   Lab Results  Component Value Date   HGBA1C 5.6 01/10/2016    Depression screen [facility] 2/9 10/19/2016 09/18/2016 08/04/2016  Decreased Interest 1 3 3   Down, Depressed, Hopeless 1 1 0  PHQ - 2 Score 2 4 3   Altered sleeping 3 2 3   Tired, decreased energy 2 1 3   Change in appetite 0 1 0  Feeling bad or failure about yourself  2 1 0  Trouble concentrating 0 2 0  Moving slowly or fidgety/restless 1 1 2   Suicidal thoughts 0 1 0  PHQ-9 Score 10 13 11   Difficult doing work/chores - - Not difficult at all  Some recent data might be hidden   GAD 7 : Generalized Anxiety Score 10/19/2016 09/18/2016 06/29/2016 03/26/2016  Nervous, Anxious, on Edge 2 1 3 1   Control/stop worrying 3 1 3 3   Worry too much - different things 3 3 3 3   Trouble relaxing 2 2 3 3   Restless 3 3 3 3   Easily annoyed or irritable 3 2 3 3   Afraid - awful might happen 1 2 3 3   Total GAD 7 Score 17 14 21 19     Assessment & Plan:   Ernest Myers was seen today for hypertension.  Diagnoses and all orders for this visit:  Essential hypertension, benign -     chlorthalidone (HYGROTON) 25 MG tablet; Take 1 tablet (25 mg total) by mouth daily.  Bilateral chronic knee pain -     DULoxetine (CYMBALTA) 20 MG capsule; Take 1 capsule (20 mg total) by mouth daily. -     naproxen (NAPROSYN) 500 MG tablet; Take 1 tablet (500 mg total) by mouth 2 (two) times daily as needed for moderate pain. Take with food  Chronic midline low back pain without sciatica -     DULoxetine (CYMBALTA) 20 MG capsule; Take 1 capsule (20 mg total) by mouth daily. -     naproxen (NAPROSYN) 500 MG tablet; Take 1 tablet (500 mg total) by mouth 2 (two) times daily as needed for moderate  pain. Take with food -     acetaminophen-codeine (TYLENOL #3) 300-30 MG tablet; Take 1 tablet by mouth every 8 (eight) hours as needed for moderate pain.  Moderate episode of recurrent major depressive disorder (HCC) -     DULoxetine (CYMBALTA) 20 MG capsule; Take 1 capsule (20 mg total) by mouth daily.  Mild single current episode of major depressive disorder (HCC) -     buPROPion (WELLBUTRIN SR) 150 MG 12 hr tablet; Take 1 tablet (150 mg total) by mouth 2 (two) times daily.  Right  arm pain -     naproxen (NAPROSYN) 500 MG tablet; Take 1 tablet (500 mg total) by mouth 2 (two) times daily as needed for moderate pain. Take with food -     gabapentin (NEURONTIN) 300 MG capsule; Take 1 capsule (300 mg total) by mouth 2 (two) times daily.  Gastroesophageal reflux disease without esophagitis -     omeprazole (PRILOSEC) 20 MG capsule; Take 1 capsule (20 mg total) by mouth daily.  Sleep apnea, unspecified type   There are no diagnoses linked to this encounter.  No orders of the defined types were placed in this encounter.   Follow-up: Return in about 3 weeks (around 11/09/2016) for BP check, add chlorthalidone .   Dessa PhiJosalyn Amitai Delaughter MD

## 2016-12-02 MED FILL — BENAZEPRIL HCL 40 MG TABLET: 40 | 30 days supply | Qty: 30 | Fill #1

## 2016-12-02 MED FILL — GABAPENTIN 300 MG CAPSULE: 300 | 60 days supply | Qty: 60 | Fill #0

## 2016-12-02 MED FILL — ?BUPROPION HCL SR 150 MG TA: 150 | 30 days supply | Qty: 60 | Fill #0

## 2016-12-02 MED FILL — ?OMEPRAZOLE DR 20 MG CAPSUL: 20 | 30 days supply | Qty: 30 | Fill #0

## 2016-12-02 MED FILL — DULoxetine HCL 20 MG CPEP: 20 | 30 days supply | Qty: 30 | Fill #0

## 2016-12-02 MED FILL — ?CHLORTHALIDONE 25 MG TABLE: 25 | 30 days supply | Qty: 30 | Fill #0

## 2016-12-02 MED FILL — ACETAMINOPHEN/COD #3 TABLET: 300-30 | 20 days supply | Qty: 60 | Fill #0

## 2016-12-02 MED FILL — NAPROXEN 500 MG TABLET: 500 | 30 days supply | Qty: 60 | Fill #0

## 2017-01-15 MED FILL — BENAZEPRIL HCL 40 MG TABLET: 40 | 30 days supply | Qty: 30 | Fill #2

## 2017-01-15 MED FILL — CHLORTHALIDONE 25 MG TABLET: 25 | 30 days supply | Qty: 30 | Fill #1

## 2017-01-27 ENCOUNTER — Encounter: Payer: Self-pay | Admitting: Family Medicine

## 2017-02-11 ENCOUNTER — Ambulatory Visit: Payer: Self-pay | Admitting: Family Medicine

## 2017-02-25 ENCOUNTER — Encounter: Payer: Self-pay | Admitting: Family Medicine

## 2017-02-25 ENCOUNTER — Ambulatory Visit: Payer: Self-pay | Attending: Family Medicine | Admitting: Family Medicine

## 2017-02-25 VITALS — BP 133/83 | HR 95 | Temp 98.2°F | Wt 384.0 lb

## 2017-02-25 DIAGNOSIS — F331 Major depressive disorder, recurrent, moderate: Secondary | ICD-10-CM

## 2017-02-25 DIAGNOSIS — M79601 Pain in right arm: Secondary | ICD-10-CM

## 2017-02-25 DIAGNOSIS — M7989 Other specified soft tissue disorders: Secondary | ICD-10-CM | POA: Insufficient documentation

## 2017-02-25 DIAGNOSIS — M25569 Pain in unspecified knee: Secondary | ICD-10-CM | POA: Insufficient documentation

## 2017-02-25 DIAGNOSIS — M545 Low back pain: Secondary | ICD-10-CM | POA: Insufficient documentation

## 2017-02-25 DIAGNOSIS — F329 Major depressive disorder, single episode, unspecified: Secondary | ICD-10-CM | POA: Insufficient documentation

## 2017-02-25 DIAGNOSIS — F320 Major depressive disorder, single episode, mild: Secondary | ICD-10-CM

## 2017-02-25 DIAGNOSIS — I10 Essential (primary) hypertension: Secondary | ICD-10-CM | POA: Insufficient documentation

## 2017-02-25 DIAGNOSIS — G8929 Other chronic pain: Secondary | ICD-10-CM | POA: Insufficient documentation

## 2017-02-25 DIAGNOSIS — M25562 Pain in left knee: Secondary | ICD-10-CM

## 2017-02-25 DIAGNOSIS — M25561 Pain in right knee: Secondary | ICD-10-CM

## 2017-02-25 DIAGNOSIS — K219 Gastro-esophageal reflux disease without esophagitis: Secondary | ICD-10-CM

## 2017-02-25 MED ORDER — DULOXETINE HCL 30 MG PO CPEP: 30.0000 mg | ORAL_CAPSULE | Freq: Every day | ORAL | 5 refills | Status: DC

## 2017-02-25 MED ORDER — CHLORTHALIDONE 25 MG PO TABS: 25.0000 mg | ORAL_TABLET | Freq: Every day | ORAL | 5 refills | Status: DC

## 2017-02-25 MED ORDER — NAPROXEN 500 MG PO TABS: 500.0000 mg | ORAL_TABLET | Freq: Two times a day (BID) | ORAL | 5 refills | Status: DC | PRN

## 2017-02-25 MED ORDER — CANE MISC
1.0000 | Freq: Every day | 0 refills | Status: DC
Start: 2017-02-25 — End: 2024-02-22

## 2017-02-25 MED ORDER — OMEPRAZOLE 20 MG PO CPDR: 20.0000 mg | DELAYED_RELEASE_CAPSULE | Freq: Every day | ORAL | 5 refills | Status: DC

## 2017-02-25 MED ORDER — BENAZEPRIL HCL 40 MG PO TABS: 40.0000 mg | ORAL_TABLET | Freq: Every day | ORAL | 5 refills | Status: DC

## 2017-02-25 MED ORDER — GABAPENTIN 300 MG PO CAPS: 300.0000 mg | ORAL_CAPSULE | Freq: Two times a day (BID) | ORAL | 5 refills | Status: DC

## 2017-02-25 MED ORDER — AMLODIPINE BESYLATE 10 MG PO TABS: 10.0000 mg | ORAL_TABLET | Freq: Every day | ORAL | 11 refills | Status: DC

## 2017-02-25 MED ORDER — CYCLOBENZAPRINE HCL 10 MG PO TABS: 10.0000 mg | ORAL_TABLET | Freq: Three times a day (TID) | ORAL | 2 refills | Status: DC | PRN

## 2017-02-25 MED ORDER — BUPROPION HCL ER (SR) 150 MG PO TB12: 150.0000 mg | ORAL_TABLET | Freq: Two times a day (BID) | ORAL | 5 refills | Status: DC

## 2017-02-25 MED FILL — CYCLOBENZAPRINE 10 MG TAB: 10 | 20 days supply | Qty: 60 | Fill #0

## 2017-02-25 MED FILL — ?OMEPRAZOLE DR 20 MG CAPSUL: 20 | 30 days supply | Qty: 30 | Fill #0

## 2017-02-25 MED FILL — AMLODIPINE BESYLATE 10 MG T: 10 | 30 days supply | Qty: 30 | Fill #0

## 2017-02-25 MED FILL — NAPROXEN 500 MG TABLET: 500 | 30 days supply | Qty: 60 | Fill #0

## 2017-02-25 MED FILL — ?CHLORTHALIDONE 25 MG TABLE: 25 | 30 days supply | Qty: 30 | Fill #0

## 2017-02-25 MED FILL — BENAZEPRIL HCL 40 MG TABLET: 40 | 30 days supply | Qty: 30 | Fill #0

## 2017-02-25 MED FILL — GABAPENTIN 300 MG CAPSULE: 300 | 30 days supply | Qty: 60 | Fill #0

## 2017-02-25 MED FILL — BUPROPION SR 150 MG TABLET: 150 | 30 days supply | Qty: 60 | Fill #0

## 2017-02-25 MED FILL — ?DULOXETINE HCL DR 30 MG CA: 30 MG | 30 days supply | Qty: 30 | Fill #0

## 2017-02-25 NOTE — Progress Notes (Signed)
Subjective:  Patient ID: Ernest Myers, male    DOB: [birth year]  Age: 46 y.o. MRN: [id number]  CC: Hypertension   HPI Ernest Myers presents for   1. CHRONIC HYPERTENSION  Disease Monitoring  Blood pressure range: not checking   Chest pain: no   Dyspnea: no   Claudication: no   Medication compliance: yes  Medication Side Effects  Lightheadedness: no   Urinary frequency: no   Edema: yes in L lower leg      2. Chronic knee pain: since 2014. Both knees. Intermittent swelling.  Has known OA. Working on weight loss. Able to stand for 10 minutes at a time. Able to walk for 5 minutes before having to rest. Current pain is 10/10. He takes tylenol #3 and pain reduces to 6/10. He has seen ortho who has advised 100 lbs weight loss and the consider surgery. He request Rx for a cane.   3. Chronic back:  Since 2014. 8/10. B/l low back pain. Radiates to gluteal area. Does not radiates to legs. Reports tension in low back at times.   4. Depression: feels depressed by lack of income. Pain. Live with his mother but cannot really help her financially.   Social History  Substance Use Topics  . Smoking status: Never Smoker  . Smokeless tobacco: Never Used  . Alcohol use Yes     Comment: social    Outpatient Medications Prior to Visit  Medication Sig Dispense Refill  . acetaminophen-codeine (TYLENOL #3) 300-30 MG tablet Take 1 tablet by mouth every 8 (eight) hours as needed for moderate pain. 60 tablet 2  . amLODipine (NORVASC) 10 MG tablet Take 1 tablet (10 mg total) by mouth daily. 30 tablet 11  . benazepril (LOTENSIN) 40 MG tablet Take 1 tablet (40 mg total) by mouth daily. 30 tablet 5  . buPROPion (WELLBUTRIN SR) 150 MG 12 hr tablet Take 1 tablet (150 mg total) by mouth 2 (two) times daily. 60 tablet 5  . chlorthalidone (HYGROTON) 25 MG tablet Take 1 tablet (25 mg total) by mouth daily. 30 tablet 5  . DULoxetine (CYMBALTA) 20 MG capsule Take 1 capsule (20 mg total) by mouth daily. 30  capsule 5  . gabapentin (NEURONTIN) 300 MG capsule Take 1 capsule (300 mg total) by mouth 2 (two) times daily. 60 capsule 5  . naproxen (NAPROSYN) 500 MG tablet Take 1 tablet (500 mg total) by mouth 2 (two) times daily as needed for moderate pain. Take with food 60 tablet 5  . omeprazole (PRILOSEC) 20 MG capsule Take 1 capsule (20 mg total) by mouth daily. 30 capsule 5   No facility-administered medications prior to visit.     ROS Review of Systems  Constitutional: Positive for fatigue. Negative for chills, fever and unexpected weight change.  Eyes: Negative for discharge and visual disturbance.  Respiratory: Negative for cough and shortness of breath.   Cardiovascular: Negative for chest pain, palpitations and leg swelling.  Gastrointestinal: Negative for abdominal pain, anal bleeding, blood in stool, constipation, diarrhea, nausea and vomiting.  Endocrine: Negative for polydipsia, polyphagia and polyuria.  Genitourinary: Negative for flank pain.  Musculoskeletal: Positive for arthralgias and joint swelling. Negative for back pain, gait problem, myalgias, neck pain and neck stiffness.  Skin: Negative for rash.  Allergic/Immunologic: Negative for immunocompromised state.  Hematological: Negative for adenopathy. Does not bruise/bleed easily.  Psychiatric/Behavioral: Positive for dysphoric mood. Negative for sleep disturbance and suicidal ideas. The patient is not nervous/anxious.  Objective:  BP 133/83   Pulse 95   Temp 98.2 F (36.8 C) (Oral)   Wt (!) 384 lb (174.2 kg)   SpO2 95%   BMI 55.10 kg/m   BP/Weight 02/25/2017 4/0/9811 05/15/4781  Systolic BP 956 213 086  Diastolic BP 83 91 97  Wt. (Lbs) 384 390.6 387.8  BMI 55.1 56.05 55.64   Wt Readings from Last 3 Encounters:  02/25/17 (!) 384 lb (174.2 kg)  10/19/16 (!) 390 lb 9.6 oz (177.2 kg)  09/18/16 (!) 387 lb 12.8 oz (175.9 kg)   BP Readings from Last 3 Encounters:  02/25/17 133/83  10/19/16 (!) 148/91  09/18/16 (!)  152/97    Physical Exam  Constitutional: He appears well-developed and well-nourished. No distress.  Obese   HENT:  Head: Normocephalic and atraumatic.  Nose: Nose normal.  Mouth/Throat: Oropharynx is clear and moist.  Neck: Normal range of motion. Neck supple.  Cardiovascular: Normal rate, regular rhythm, normal heart sounds and intact distal pulses.   Pulmonary/Chest: Effort normal and breath sounds normal.  Musculoskeletal: He exhibits no edema.  Neurological: He is alert.  Skin: Skin is warm and dry. No rash noted. No erythema.  Psychiatric: He has a normal mood and affect.   Lab Results  Component Value Date   HGBA1C 5.6 01/10/2016    Depression screen [facility] 2/9 02/25/2017 10/19/2016 09/18/2016  Decreased Interest '1 1 3  ' Down, Depressed, Hopeless '2 1 1  ' PHQ - 2 Score '3 2 4  ' Altered sleeping '3 3 2  ' Tired, decreased energy '3 2 1  ' Change in appetite 0 0 1  Feeling bad or failure about yourself  '3 2 1  ' Trouble concentrating 3 0 2  Moving slowly or fidgety/restless '2 1 1  ' Suicidal thoughts 1 0 1  PHQ-9 Score '18 10 13  ' Difficult doing work/chores - - -  Some recent data might be hidden   GAD 7 : Generalized Anxiety Score 02/25/2017 10/19/2016 09/18/2016 06/29/2016  Nervous, Anxious, on Edge '3 2 1 3  ' Control/stop worrying '3 3 1 3  ' Worry too much - different things '3 3 3 3  ' Trouble relaxing '3 2 2 3  ' Restless '3 3 3 3  ' Easily annoyed or irritable '2 3 2 3  ' Afraid - awful might happen '1 1 2 3  ' Total GAD 7 Score '18 17 14 21    ' Assessment & Plan:   Ernest Myers was seen today for hypertension.  Diagnoses and all orders for this visit:  Bilateral chronic knee pain -     Misc. Devices (CANE) MISC; 1 each by Does not apply route daily. -     Discontinue: cyclobenzaprine (FLEXERIL) 10 MG tablet; Take 1 tablet (10 mg total) by mouth 3 (three) times daily as needed for muscle spasms. -     Discontinue: naproxen (NAPROSYN) 500 MG tablet; Take 1 tablet (500 mg total) by mouth 2 (two) times  daily as needed for moderate pain. Take with food -     Discontinue: DULoxetine (CYMBALTA) 30 MG capsule; Take 1 capsule (30 mg total) by mouth daily. -     Uric Acid -     cyclobenzaprine (FLEXERIL) 10 MG tablet; Take 1 tablet (10 mg total) by mouth 3 (three) times daily as needed for muscle spasms. -     DULoxetine (CYMBALTA) 30 MG capsule; Take 1 capsule (30 mg total) by mouth daily. -     naproxen (NAPROSYN) 500 MG tablet; Take 1 tablet (500 mg total)  by mouth 2 (two) times daily as needed for moderate pain. Take with food  Chronic midline low back pain without sciatica -     Discontinue: cyclobenzaprine (FLEXERIL) 10 MG tablet; Take 1 tablet (10 mg total) by mouth 3 (three) times daily as needed for muscle spasms. -     Discontinue: naproxen (NAPROSYN) 500 MG tablet; Take 1 tablet (500 mg total) by mouth 2 (two) times daily as needed for moderate pain. Take with food -     Discontinue: DULoxetine (CYMBALTA) 30 MG capsule; Take 1 capsule (30 mg total) by mouth daily. -     Uric Acid -     cyclobenzaprine (FLEXERIL) 10 MG tablet; Take 1 tablet (10 mg total) by mouth 3 (three) times daily as needed for muscle spasms. -     DULoxetine (CYMBALTA) 30 MG capsule; Take 1 capsule (30 mg total) by mouth daily. -     naproxen (NAPROSYN) 500 MG tablet; Take 1 tablet (500 mg total) by mouth 2 (two) times daily as needed for moderate pain. Take with food  Essential hypertension, benign -     Discontinue: chlorthalidone (HYGROTON) 25 MG tablet; Take 1 tablet (25 mg total) by mouth daily. -     BMP8+EGFR -     chlorthalidone (HYGROTON) 25 MG tablet; Take 1 tablet (25 mg total) by mouth daily.  Essential hypertension -     Discontinue: benazepril (LOTENSIN) 40 MG tablet; Take 1 tablet (40 mg total) by mouth daily. -     Discontinue: amLODipine (NORVASC) 10 MG tablet; Take 1 tablet (10 mg total) by mouth daily. -     amLODipine (NORVASC) 10 MG tablet; Take 1 tablet (10 mg total) by mouth daily. -      benazepril (LOTENSIN) 40 MG tablet; Take 1 tablet (40 mg total) by mouth daily.  Mild single current episode of major depressive disorder (HCC) -     Discontinue: buPROPion (WELLBUTRIN SR) 150 MG 12 hr tablet; Take 1 tablet (150 mg total) by mouth 2 (two) times daily. -     buPROPion (WELLBUTRIN SR) 150 MG 12 hr tablet; Take 1 tablet (150 mg total) by mouth 2 (two) times daily.  Right arm pain -     Discontinue: gabapentin (NEURONTIN) 300 MG capsule; Take 1 capsule (300 mg total) by mouth 2 (two) times daily. -     Discontinue: naproxen (NAPROSYN) 500 MG tablet; Take 1 tablet (500 mg total) by mouth 2 (two) times daily as needed for moderate pain. Take with food -     gabapentin (NEURONTIN) 300 MG capsule; Take 1 capsule (300 mg total) by mouth 2 (two) times daily. -     naproxen (NAPROSYN) 500 MG tablet; Take 1 tablet (500 mg total) by mouth 2 (two) times daily as needed for moderate pain. Take with food  Gastroesophageal reflux disease without esophagitis -     Discontinue: omeprazole (PRILOSEC) 20 MG capsule; Take 1 capsule (20 mg total) by mouth daily. -     omeprazole (PRILOSEC) 20 MG capsule; Take 1 capsule (20 mg total) by mouth daily.  Moderate episode of recurrent major depressive disorder (HCC) -     Discontinue: DULoxetine (CYMBALTA) 30 MG capsule; Take 1 capsule (30 mg total) by mouth daily. -     DULoxetine (CYMBALTA) 30 MG capsule; Take 1 capsule (30 mg total) by mouth daily.   There are no diagnoses linked to this encounter.  No orders of the defined types were placed in this  encounter.   Follow-up: Return in about 4 weeks (around 03/25/2017) for chronic pain and depression.   Boykin Nearing MD

## 2017-02-25 NOTE — Patient Instructions (Addendum)
Purvis was seen today for hypertension.  Diagnoses and all orders for this visit:  Bilateral chronic knee pain -     Misc. Devices (CANE) MISC; 1 each by Does not apply route daily. -     cyclobenzaprine (FLEXERIL) 10 MG tablet; Take 1 tablet (10 mg total) by mouth 3 (three) times daily as needed for muscle spasms. -     naproxen (NAPROSYN) 500 MG tablet; Take 1 tablet (500 mg total) by mouth 2 (two) times daily as needed for moderate pain. Take with food -     DULoxetine (CYMBALTA) 30 MG capsule; Take 1 capsule (30 mg total) by mouth daily. -     Uric Acid  Chronic midline low back pain without sciatica -     cyclobenzaprine (FLEXERIL) 10 MG tablet; Take 1 tablet (10 mg total) by mouth 3 (three) times daily as needed for muscle spasms. -     naproxen (NAPROSYN) 500 MG tablet; Take 1 tablet (500 mg total) by mouth 2 (two) times daily as needed for moderate pain. Take with food -     DULoxetine (CYMBALTA) 30 MG capsule; Take 1 capsule (30 mg total) by mouth daily. -     Uric Acid  Essential hypertension, benign -     chlorthalidone (HYGROTON) 25 MG tablet; Take 1 tablet (25 mg total) by mouth daily. -     BMP8+EGFR  Essential hypertension -     benazepril (LOTENSIN) 40 MG tablet; Take 1 tablet (40 mg total) by mouth daily. -     amLODipine (NORVASC) 10 MG tablet; Take 1 tablet (10 mg total) by mouth daily.  Mild single current episode of major depressive disorder (HCC) -     buPROPion (WELLBUTRIN SR) 150 MG 12 hr tablet; Take 1 tablet (150 mg total) by mouth 2 (two) times daily.  Right arm pain -     gabapentin (NEURONTIN) 300 MG capsule; Take 1 capsule (300 mg total) by mouth 2 (two) times daily. -     naproxen (NAPROSYN) 500 MG tablet; Take 1 tablet (500 mg total) by mouth 2 (two) times daily as needed for moderate pain. Take with food  Gastroesophageal reflux disease without esophagitis -     omeprazole (PRILOSEC) 20 MG capsule; Take 1 capsule (20 mg total) by mouth  daily.  Moderate episode of recurrent major depressive disorder (HCC) -     DULoxetine (CYMBALTA) 30 MG capsule; Take 1 capsule (30 mg total) by mouth daily.    F/u in 4 weeks for chronic pain and depression   Dr. Adrian Blackwater

## 2017-02-25 NOTE — Assessment & Plan Note (Signed)
A: persistent with thoughts of suicide P: Increase Cymbalta from 20 to 30 mg daily Continue Wellbutrin 150 mg BID

## 2017-02-25 NOTE — Assessment & Plan Note (Signed)
Well-controlled.  Continue current regimen. 

## 2017-02-25 NOTE — Assessment & Plan Note (Addendum)
Chronic pain Due to OA Increase Cymbalta Continue gabapentin, naproxen and tylenol #3

## 2017-02-25 NOTE — Assessment & Plan Note (Signed)
Chronic OA pain Plan: Weight loss Naproxen tylenol #3 Cane Rx Will provide letter to assist with disability application process

## 2017-03-18 ENCOUNTER — Telehealth: Payer: Self-pay | Admitting: Family Medicine

## 2017-03-18 NOTE — Telephone Encounter (Signed)
Pt. Called stating that his PCP was going to write a letter stating that he can not stand up more than 10 minutes. Please f/u with pt.

## 2017-03-18 NOTE — Telephone Encounter (Signed)
Will route to PCP 

## 2017-03-25 NOTE — Telephone Encounter (Signed)
Please inform patient Letter written and ready for pick up  

## 2017-03-26 ENCOUNTER — Ambulatory Visit: Payer: Self-pay | Attending: Family Medicine | Admitting: Family Medicine

## 2017-03-26 ENCOUNTER — Encounter: Payer: Self-pay | Admitting: Family Medicine

## 2017-03-26 VITALS — BP 113/73 | HR 89 | Temp 98.5°F | Ht 70.0 in | Wt 389.8 lb

## 2017-03-26 DIAGNOSIS — F40298 Other specified phobia: Secondary | ICD-10-CM | POA: Insufficient documentation

## 2017-03-26 DIAGNOSIS — F331 Major depressive disorder, recurrent, moderate: Secondary | ICD-10-CM | POA: Insufficient documentation

## 2017-03-26 DIAGNOSIS — M25561 Pain in right knee: Secondary | ICD-10-CM | POA: Insufficient documentation

## 2017-03-26 DIAGNOSIS — M25562 Pain in left knee: Secondary | ICD-10-CM | POA: Insufficient documentation

## 2017-03-26 DIAGNOSIS — Z79899 Other long term (current) drug therapy: Secondary | ICD-10-CM | POA: Insufficient documentation

## 2017-03-26 DIAGNOSIS — M545 Low back pain: Secondary | ICD-10-CM | POA: Insufficient documentation

## 2017-03-26 DIAGNOSIS — G8929 Other chronic pain: Secondary | ICD-10-CM | POA: Insufficient documentation

## 2017-03-26 DIAGNOSIS — I10 Essential (primary) hypertension: Secondary | ICD-10-CM | POA: Insufficient documentation

## 2017-03-26 DIAGNOSIS — Z6841 Body Mass Index (BMI) 40.0 and over, adult: Secondary | ICD-10-CM | POA: Insufficient documentation

## 2017-03-26 NOTE — Patient Instructions (Addendum)
Ernest Myers was seen today for depression and pain.  Diagnoses and all orders for this visit:  Bilateral chronic knee pain -     Ambulatory referral to Orthopedic Surgery  Please apply for [facility] discount and orange card, you can also inquire if any of your medications are on the PASS (medications assistance) list.   F/u in 3 months for low back pain, knee pain, HTN and depression   Dr. Armen PickupFunches

## 2017-03-26 NOTE — Assessment & Plan Note (Signed)
A: improved. No longer having suicidal thoughts Med: compliant with Cymbalta 30 mg and Wellbutrin 150 mg BID P; Continue current regimen

## 2017-03-26 NOTE — Assessment & Plan Note (Signed)
A: chronic pain due to OA with swelling Med: compliant, using tyllenol #3  Very sparingly P: He declined intraarticular steroid injections today Ortho referral placed, patient to apply for orange card

## 2017-03-26 NOTE — Progress Notes (Signed)
Subjective:  Patient ID: Ernest Myers, male    DOB: 10/31/1970  Age: 46 y.o. 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 Chronic knee pain: since 2014. Both knees. Intermittent swelling.  Has known OA. Working on weight loss. Able to stand for 10 minutes at a time. Able to walk for 5 minutes before having to rest. Current pain is 10/10. He takes tylenol #3 and pain reduces to 6/10. He has seen ortho who has advised 100 lbs weight loss and the consider surgery. He received an Rx for a cane at the last visit. He has not purchased the cane yet due to cost.   3. Chronic back:  Since 2014. 8/10. B/l low back pain. Radiates to gluteal area. Does not radiates to legs. Reports tension in low back at times.   4. Depression: he reports mood has improved. He is taking Wellbutrin and Cymbalta. He has applied for disability on the basis of his chronic knee and back pain and is awaiting a decision.   Social History  Substance Use Topics  . Smoking status: Never Smoker  . Smokeless tobacco: Never Used  . Alcohol use Yes     Comment: social    Outpatient Medications Prior to Visit  Medication Sig Dispense Refill  . acetaminophen-codeine (TYLENOL #3) 300-30 MG tablet Take 1 tablet by mouth every 8 (eight) hours as needed for moderate pain. 60 tablet 2  . amLODipine (NORVASC) 10 MG tablet Take 1 tablet (10 mg total) by mouth daily. 30 tablet 11  . benazepril (LOTENSIN) 40 MG tablet Take 1 tablet (40 mg total) by mouth daily. 30 tablet 5  . buPROPion (WELLBUTRIN SR) 150 MG 12 hr tablet Take 1 tablet (150 mg total) by mouth 2 (two) times daily. 60 tablet 5  . chlorthalidone (HYGROTON) 25 MG tablet Take 1 tablet (25 mg total) by mouth daily. 30 tablet 5  . cyclobenzaprine (FLEXERIL) 10 MG tablet Take 1 tablet (10 mg total) by  mouth 3 (three) times daily as needed for muscle spasms. 60 tablet 2  . DULoxetine (CYMBALTA) 30 MG capsule Take 1 capsule (30 mg total) by mouth daily. 30 capsule 5  . gabapentin (NEURONTIN) 300 MG capsule Take 1 capsule (300 mg total) by mouth 2 (two) times daily. 60 capsule 5  . Misc. Devices (CANE) MISC 1 each by Does not apply route daily. 1 each 0  . naproxen (NAPROSYN) 500 MG tablet Take 1 tablet (500 mg total) by mouth 2 (two) times daily as needed for moderate pain. Take with food 60 tablet 5  . omeprazole (PRILOSEC) 20 MG capsule Take 1 capsule (20 mg total) by mouth daily. 30 capsule 5   No facility-administered medications prior to visit.     ROS Review of Systems  Constitutional: Positive for fatigue. Negative for chills, fever and unexpected weight change.  Eyes: Negative for discharge and visual disturbance.  Respiratory: Negative for cough and shortness of breath.   Cardiovascular: Negative for chest pain, palpitations and leg swelling.  Gastrointestinal: Negative for abdominal pain, anal bleeding, blood in stool, constipation, diarrhea, nausea and vomiting.  Endocrine: Negative for polydipsia, polyphagia and polyuria.  Genitourinary: Negative for flank pain.  Musculoskeletal: Positive for arthralgias, back pain and joint swelling. Negative for gait problem, myalgias, neck pain and neck  stiffness.  Skin: Negative for rash.  Allergic/Immunologic: Negative for immunocompromised state.  Hematological: Negative for adenopathy. Does not bruise/bleed easily.  Psychiatric/Behavioral: Positive for dysphoric mood. Negative for sleep disturbance and suicidal ideas. The patient is not nervous/anxious.     Objective:  BP 113/73   Pulse 89   Temp 98.5 F (36.9 C) (Oral)   Ht 5\' 10"  (1.778 m)   Wt (!) 389 lb 12.8 oz (176.8 kg)   SpO2 95%   BMI 55.93 kg/m   BP/Weight 03/26/2017 02/25/2017 10/19/2016  Systolic BP 113 133 148  Diastolic BP 73 83 91  Wt. (Lbs) 389.8 384 390.6  BMI  55.93 55.1 56.05   Wt Readings from Last 3 Encounters:  03/26/17 (!) 389 lb 12.8 oz (176.8 kg)  02/25/17 (!) 384 lb (174.2 kg)  10/19/16 (!) 390 lb 9.6 oz (177.2 kg)    Physical Exam  Constitutional: He appears well-developed and well-nourished. No distress.  Obese   HENT:  Head: Normocephalic and atraumatic.  Nose: Nose normal.  Mouth/Throat: Oropharynx is clear and moist.  Neck: Normal range of motion. Neck supple.  Cardiovascular: Normal rate, regular rhythm, normal heart sounds and intact distal pulses.   Pulmonary/Chest: Effort normal and breath sounds normal.  Musculoskeletal: He exhibits no edema.       Right knee: He exhibits swelling. Tenderness found. Medial joint line and lateral joint line tenderness noted.       Left knee: He exhibits swelling. Tenderness found. Medial joint line and lateral joint line tenderness noted.  Neurological: He is alert.  Skin: Skin is warm and dry. No rash noted. No erythema.  Psychiatric: He has a normal mood and affect.   Lab Results  Component Value Date   HGBA1C 5.6 01/10/2016    Depression screen [facility] 2/9 03/26/2017 02/25/2017 10/19/2016  Decreased Interest 0 1 1  Down, Depressed, Hopeless 3 2 1   PHQ - 2 Score 3 3 2   Altered sleeping 3 3 3   Tired, decreased energy 1 3 2   Change in appetite 0 0 0  Feeling bad or failure about yourself  2 3 2   Trouble concentrating 3 3 0  Moving slowly or fidgety/restless 2 2 1   Suicidal thoughts 0 1 0  PHQ-9 Score 14 18 10   Difficult doing work/chores - - -  Some recent data might be hidden   GAD 7 : Generalized Anxiety Score 03/26/2017 02/25/2017 10/19/2016 09/18/2016  Nervous, Anxious, on Edge 2 3 2 1   Control/stop worrying 3 3 3 1   Worry too much - different things 3 3 3 3   Trouble relaxing 3 3 2 2   Restless 3 3 3 3   Easily annoyed or irritable 3 2 3 2   Afraid - awful might happen 2 1 1 2   Total GAD 7 Score 19 18 17 14     Assessment & Plan:   Ernest Myers was seen today for depression and  pain.  Diagnoses and all orders for this visit:  Bilateral chronic knee pain -     Ambulatory referral to Orthopedic Surgery  Moderate episode of recurrent major depressive disorder (HCC)   There are no diagnoses linked to this encounter.  No orders of the defined types were placed in this encounter.   Follow-up: Return in about 3 months (around 06/26/2017) for low back pain, knee pain, HTN and depression .   Dessa Phi MD

## 2017-03-30 NOTE — Telephone Encounter (Signed)
Pt received letter on OV

## 2017-04-26 ENCOUNTER — Ambulatory Visit: Payer: Self-pay | Attending: Internal Medicine

## 2017-05-21 ENCOUNTER — Ambulatory Visit (INDEPENDENT_AMBULATORY_CARE_PROVIDER_SITE_OTHER): Payer: PRIVATE HEALTH INSURANCE

## 2017-05-21 ENCOUNTER — Encounter (INDEPENDENT_AMBULATORY_CARE_PROVIDER_SITE_OTHER): Payer: Self-pay | Admitting: Orthopaedic Surgery

## 2017-05-21 ENCOUNTER — Ambulatory Visit (INDEPENDENT_AMBULATORY_CARE_PROVIDER_SITE_OTHER): Payer: PRIVATE HEALTH INSURANCE | Admitting: Orthopaedic Surgery

## 2017-05-21 DIAGNOSIS — M1711 Unilateral primary osteoarthritis, right knee: Secondary | ICD-10-CM | POA: Diagnosis not present

## 2017-05-21 DIAGNOSIS — M170 Bilateral primary osteoarthritis of knee: Secondary | ICD-10-CM | POA: Insufficient documentation

## 2017-05-21 DIAGNOSIS — M1712 Unilateral primary osteoarthritis, left knee: Secondary | ICD-10-CM

## 2017-05-21 NOTE — Addendum Note (Signed)
Addended by: Albertina ParrGARCIA, Jakeisha Stricker on: 05/21/2017 10:00 AM   Modules accepted: Orders

## 2017-05-21 NOTE — Progress Notes (Signed)
   Office Visit Note   Patient: Ernest Myers           Date of Birth: 02/25/1971           MRN: [id number] Visit Date: 05/21/2017              Requested by: Dessa PhiFunches, Josalyn, MD [address] [place], [place] [id number] PCP: Dessa PhiFunches, Josalyn, MD   Assessment & Plan: Visit Diagnoses:  1. Primary osteoarthritis of both knees     Plan: Overall impression is the best general joint disease of both knees. However patient is morbidly obese with BMI greater than 55 and a poor surgical candidate. We will make a referral to Dr. Dalbert GarnetBeasley for weight loss. Questions encouraged and answered. Follow-up as needed.  Follow-Up Instructions: Return if symptoms worsen or fail to improve.   Orders:  Orders Placed This Encounter  Procedures  . XR KNEE 3 VIEW LEFT  . XR KNEE 3 VIEW RIGHT   No orders of the defined types were placed in this encounter.     Procedures: No procedures performed   Clinical Data: No additional findings.   Subjective: Chief Complaint  Patient presents with  . Right Knee - Pain  . Left Knee - Pain    Patient follows up today for bilateral knee pain worse on the right. He continues to be morbidly obese with a BMI of greater than 55. His pain is severely affecting his quality of life and ADLs denies any numbness and tingling.    Review of Systems  Constitutional: Negative.   All other systems reviewed and are negative.    Objective: Vital Signs: There were no vitals taken for this visit.  Physical Exam  Constitutional: He is oriented to person, place, and time. He appears well-developed and well-nourished.  Pulmonary/Chest: Effort normal.  Abdominal: Soft.  Neurological: He is alert and oriented to person, place, and time.  Skin: Skin is warm.  Psychiatric: He has a normal mood and affect. His behavior is normal. Judgment and thought content normal.  Nursing note and vitals reviewed.   Ortho Exam Bilateral knee exam shows no joint effusion. Slight  varus deformity. Specialty Comments:  No specialty comments available.  Imaging: No results found.   PMFS History: Patient Active Problem List   Diagnosis Date Noted  . Primary osteoarthritis of both knees 05/21/2017  . 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 Gastroesophageal reflux disease without esophagitis 03/26/2016  . Depression 03/26/2016  . Bilateral chronic knee pain 01/10/2016  . Right arm pain 05/01/2015  . Low back pain 12/03/2014  . Paresthesia 12/03/2014  . Obesity, morbid, BMI 50 or higher (HCC) 11/11/2006  . Essential hypertension, benign 11/11/2006  . Sleep apnea 11/11/2006   Past Medical History:  Diagnosis Date  . Arthritis   . Diabetes mellitus without complication (HCC)   . Fatty liver   . Hypertension     Family History  Problem Relation Age of Onset  . Diabetes Mother   . Heart disease Father     No past surgical history on file. Social History   Occupational History  . Not on file.   Social History Main Topics  . Smoking status: Never Smoker  . Smokeless tobacco: Never Used  . Alcohol use Yes     Comment: social  . Drug use: No  . Sexual activity: Not on file

## 2017-07-22 ENCOUNTER — Ambulatory Visit: Payer: Self-pay | Admitting: Family Medicine

## 2017-07-23 ENCOUNTER — Ambulatory Visit: Payer: Self-pay | Attending: Family Medicine | Admitting: Family Medicine

## 2017-07-23 ENCOUNTER — Encounter: Payer: Self-pay | Admitting: Family Medicine

## 2017-07-23 VITALS — BP 144/89 | HR 89 | Temp 98.6°F | Resp 18 | Ht 71.0 in | Wt 385.2 lb

## 2017-07-23 DIAGNOSIS — Z760 Encounter for issue of repeat prescription: Secondary | ICD-10-CM | POA: Insufficient documentation

## 2017-07-23 DIAGNOSIS — F331 Major depressive disorder, recurrent, moderate: Secondary | ICD-10-CM | POA: Insufficient documentation

## 2017-07-23 DIAGNOSIS — M25561 Pain in right knee: Secondary | ICD-10-CM | POA: Insufficient documentation

## 2017-07-23 DIAGNOSIS — Z6841 Body Mass Index (BMI) 40.0 and over, adult: Secondary | ICD-10-CM | POA: Insufficient documentation

## 2017-07-23 DIAGNOSIS — Z79899 Other long term (current) drug therapy: Secondary | ICD-10-CM | POA: Insufficient documentation

## 2017-07-23 DIAGNOSIS — Z791 Long term (current) use of non-steroidal anti-inflammatories (NSAID): Secondary | ICD-10-CM | POA: Insufficient documentation

## 2017-07-23 DIAGNOSIS — Z8709 Personal history of other diseases of the respiratory system: Secondary | ICD-10-CM

## 2017-07-23 DIAGNOSIS — I10 Essential (primary) hypertension: Secondary | ICD-10-CM | POA: Insufficient documentation

## 2017-07-23 DIAGNOSIS — H538 Other visual disturbances: Secondary | ICD-10-CM | POA: Insufficient documentation

## 2017-07-23 DIAGNOSIS — R1011 Right upper quadrant pain: Secondary | ICD-10-CM | POA: Insufficient documentation

## 2017-07-23 DIAGNOSIS — H547 Unspecified visual loss: Secondary | ICD-10-CM

## 2017-07-23 DIAGNOSIS — J45909 Unspecified asthma, uncomplicated: Secondary | ICD-10-CM | POA: Insufficient documentation

## 2017-07-23 DIAGNOSIS — M25562 Pain in left knee: Secondary | ICD-10-CM | POA: Insufficient documentation

## 2017-07-23 DIAGNOSIS — G8929 Other chronic pain: Secondary | ICD-10-CM | POA: Insufficient documentation

## 2017-07-23 MED ORDER — DULOXETINE HCL 40 MG PO CPEP: 40.0000 mg | ORAL_CAPSULE | Freq: Every day | ORAL | 11 refills | Status: DC

## 2017-07-23 MED ORDER — CYCLOBENZAPRINE HCL 10 MG PO TABS: 10.0000 mg | ORAL_TABLET | Freq: Three times a day (TID) | ORAL | 2 refills | Status: DC | PRN

## 2017-07-23 MED ORDER — ALBUTEROL SULFATE HFA 108 (90 BASE) MCG/ACT IN AERS: 2.0000 | INHALATION_SPRAY | Freq: Four times a day (QID) | RESPIRATORY_TRACT | 2 refills | Status: DC | PRN

## 2017-07-23 MED ORDER — CHLORTHALIDONE 50 MG PO TABS: 50.0000 mg | ORAL_TABLET | Freq: Every day | ORAL | 5 refills | Status: DC

## 2017-07-23 MED ORDER — OMEPRAZOLE 20 MG PO CPDR: 20.0000 mg | DELAYED_RELEASE_CAPSULE | Freq: Two times a day (BID) | ORAL | 5 refills | Status: DC

## 2017-07-23 MED ORDER — BUPROPION HCL ER (SR) 150 MG PO TB12: 150.0000 mg | ORAL_TABLET | Freq: Two times a day (BID) | ORAL | 11 refills | Status: DC

## 2017-07-23 MED ORDER — AMLODIPINE BESYLATE 10 MG PO TABS: 10.0000 mg | ORAL_TABLET | Freq: Every day | ORAL | 5 refills | Status: DC

## 2017-07-23 MED ORDER — GABAPENTIN 300 MG PO CAPS: 300.0000 mg | ORAL_CAPSULE | Freq: Two times a day (BID) | ORAL | 5 refills | Status: DC

## 2017-07-23 MED ORDER — BENAZEPRIL HCL 40 MG PO TABS: 40.0000 mg | ORAL_TABLET | Freq: Every day | ORAL | 5 refills | Status: DC

## 2017-07-23 MED FILL — !VENTOLIN HFA INHALER: 108 (90 BAS | 25 days supply | Qty: 18 | Fill #0

## 2017-07-23 MED FILL — GABAPENTIN 300 MG CAPSULE: 300 | 30 days supply | Qty: 60 | Fill #0

## 2017-07-23 MED FILL — ?CYCLOBENZAPRINE 10 MG TABL: 10 | 20 days supply | Qty: 60 | Fill #0

## 2017-07-23 MED FILL — ?OMEPRAZOLE DR 20 MG CAPSUL: 20 | 30 days supply | Qty: 60 | Fill #0

## 2017-07-23 MED FILL — CHLORTHALIDONE 50 MG TABLET: 50 | 30 days supply | Qty: 30 | Fill #0

## 2017-07-23 MED FILL — AMLODIPINE BESYLATE 10 MG T: 10 | 30 days supply | Qty: 30 | Fill #0

## 2017-07-23 MED FILL — ?BUPROPION HCL SR 150MG TAB: 150 | 30 days supply | Qty: 60 | Fill #0

## 2017-07-23 MED FILL — BENAZEPRIL HCL 40 MG TABLET: 40 | 30 days supply | Qty: 30 | Fill #0

## 2017-07-23 NOTE — Progress Notes (Signed)
Subjective:  Patient ID: Ernest Myers, male    DOB: Feb 06, 1971  Age: 46 y.o. MRN: [id number]  CC: Hypertension   HPI Ernest Myers presents for to reestablish care. PMH inc.HTN, Depression, and chronic knee pain.  He is not exercising and is not adherent to low salt diet.  He does not check BP at home. Cardiac symptoms none. Patient denies chest pain, claudication, lower extremity edema and syncope.  Cardiovascular risk factors: hypertension, male gender, obesity (BMI >= 30 kg/m2) and sedentary lifestyle. Use of agents associated with hypertension: NSAIDS. History of target organ damage: none. He does report SOB and personal history of asthma. Patient states it was a few years ago. He denies any inhaler use. Worsening of symptoms 2 week ago. He does report occasional cough and wheezig, no fevers, hemoptysis, no URI symptoms. History of depression he denies any SI/HI. He reports being diagnosed a few years ago. Elevated PHQ/GAD. He declines speaking with LCSW at this time. He denies referral at this time. He reports adherence with antidepressant medications. Chronic bilateral knee pain. He reports seeing orthopedic specialist who recommend bilateral knee replacement but recommend he loses weight first. He walks with the assistance of a cane.   Outpatient Medications Prior to Visit  Medication Sig Dispense Refill  . acetaminophen-codeine (TYLENOL #3) 300-30 MG tablet Take 1 tablet by mouth every 8 (eight) hours as needed for moderate pain. 60 tablet 2  . Misc. Devices (CANE) MISC 1 each by Does not apply route daily. 1 each 0  . naproxen (NAPROSYN) 500 MG tablet Take 1 tablet (500 mg total) by mouth 2 (two) times daily as needed for moderate pain. Take with food 60 tablet 5  . amLODipine (NORVASC) 10 MG tablet Take 1 tablet (10 mg total) by mouth daily. 30 tablet 11  . benazepril (LOTENSIN) 40 MG tablet Take 1 tablet (40 mg total) by mouth daily. 30 tablet 5  . buPROPion (WELLBUTRIN SR) 150 MG 12  hr tablet Take 1 tablet (150 mg total) by mouth 2 (two) times daily. 60 tablet 5  . chlorthalidone (HYGROTON) 25 MG tablet Take 1 tablet (25 mg total) by mouth daily. 30 tablet 5  . cyclobenzaprine (FLEXERIL) 10 MG tablet Take 1 tablet (10 mg total) by mouth 3 (three) times daily as needed for muscle spasms. 60 tablet 2  . DULoxetine (CYMBALTA) 30 MG capsule Take 1 capsule (30 mg total) by mouth daily. 30 capsule 5  . gabapentin (NEURONTIN) 300 MG capsule Take 1 capsule (300 mg total) by mouth 2 (two) times daily. 60 capsule 5  . omeprazole (PRILOSEC) 20 MG capsule Take 1 capsule (20 mg total) by mouth daily. 30 capsule 5   No facility-administered medications prior to visit.     ROS Review of Systems  Constitutional:       Obesity  Respiratory: Positive for shortness of breath and wheezing.   Cardiovascular: Negative.   Musculoskeletal: Positive for arthralgias.  Psychiatric/Behavioral: Negative for suicidal ideas. Dysphoric mood: history of depressive disorder.       Objective:  BP (!) 144/89   Pulse 89   Temp 98.6 F (37 C) (Oral)   Resp 18   Ht 5\' 11"  (1.803 m)   Wt (!) 385 lb 3.2 oz (174.7 kg)   SpO2 94%   BMI 53.72 kg/m   BP/Weight 07/23/2017 03/26/2017 02/25/2017  Systolic BP 144 113 133  Diastolic BP 89 73 83  Wt. (Lbs) 385.2 389.8 384  BMI 53.72 55.93  55.1     Physical Exam  Constitutional: He appears well-developed and well-nourished.  HENT:  Head: Normocephalic and atraumatic.  Right Ear: External ear normal.  Left Ear: External ear normal.  Nose: Nose normal.  Mouth/Throat: Oropharynx is clear and moist.  Eyes: Conjunctivae are normal. Pupils are equal, round, and reactive to light.  Neck: No JVD present.  Cardiovascular: Normal rate, regular rhythm, normal heart sounds and intact distal pulses.  Pulmonary/Chest: Effort normal and breath sounds normal.  Abdominal: Soft. Bowel sounds are normal. There is tenderness (RUQ).  Skin: Skin is warm and dry.    Psychiatric: He expresses no homicidal and no suicidal ideation. He expresses no suicidal plans and no homicidal plans.  Nursing note and vitals reviewed.  [facility] 07/23/2017 03/26/2017 02/25/2017  Decreased Interest 3 0 1  Down, Depressed, Hopeless 3 3 2   PHQ - 2 Score 6 3 3   Altered sleeping 3 3 3   Tired, decreased energy 3 1 3   Change in appetite 0 0 0  Feeling bad or failure about yourself  3 2 3   Trouble concentrating 2 3 3   Moving slowly or fidgety/restless 1 2 2   Suicidal thoughts 1 0 1  PHQ-9 Score 19 14 18   Difficult doing work/chores - - -  Some recent data might be hidden   GAD 7 : Generalized Anxiety Score 07/23/2017 03/26/2017 02/25/2017 10/19/2016  Nervous, Anxious, on Edge 2 2 3 2   Control/stop worrying 3 3 3 3   Worry too much - different things 3 3 3 3   Trouble relaxing 2 3 3 2   Restless 2 3 3 3   Easily annoyed or irritable 2 3 2 3   Afraid - awful might happen 2 2 1 1   Total GAD 7 Score 16 19 18 17       Assessment & Plan:   1. Essential hypertension, benign Schedule BP recheck in 2 weeks with nurse. If BP is greater than 90/60 (MAP 65 or greater) but not less than 130/80 may increase dose of  cholorothiadone and recheck in another 2 weeks.  Follow up with PCP in 2 months. - CMP and Liver - Lipid Panel - amLODipine (NORVASC) 10 MG tablet; Take 1 tablet (10 mg total) daily by mouth.  Dispense: 30 tablet; Refill: 5 - benazepril (LOTENSIN) 40 MG tablet; Take 1 tablet (40 mg total) daily by mouth.  Dispense: 30 tablet; Refill: 5 - chlorthalidone (HYGROTON) 50 MG tablet; Take 1 tablet (50 mg total) daily by mouth.  Dispense: 30 tablet; Refill: 5  2. RUQ abdominal pain Denies any changes to bowel habits, weight loss.  - CMP and Liver - CBC - Lipid Panel  3. Chronic pain of both knees Cont.use of NSAID's as needed. - cyclobenzaprine (FLEXERIL) 10 MG tablet; Take 1 tablet (10 mg total) 3 (three) times daily as needed by mouth for muscle spasms.   Dispense: 60 tablet; Refill: 2   4. Morbid obesity (HCC)  - Amb ref to Medical Nutrition Therapy-MNT  5. Personal history of asthma  - albuterol (PROVENTIL HFA;VENTOLIN HFA) 108 (90 Base) MCG/ACT inhaler; Inhale 2 puffs every 6 (six) hours as needed into the lungs for wheezing or shortness of breath.  Dispense: 8 g; Refill: 2  6. Moderate episode of recurrent major depressive disorder (HCC) Dosage of Cymbalta increased. He declines counseling or referrals at this time. - DULoxetine 40 MG CPEP; Take 40 mg daily by mouth.  Dispense: 30 capsule; Refill: 11 - buPROPion (WELLBUTRIN SR) 150 MG  12 hr tablet; Take 1 tablet (150 mg total) 2 (two) times daily by mouth.  Dispense: 60 tablet; Refill: 11  7. Decreased visual acuity Visual acuity screen. - Ambulatory referral to Ophthalmology  8. Medication refill  - omeprazole (PRILOSEC) 20 MG capsule; Take 1 capsule (20 mg total) 2 (two) times daily before a meal by mouth. As needed.  Dispense: 60 capsule; Refill: 5 - gabapentin (NEURONTIN) 300 MG capsule; Take 1 capsule (300 mg total) 2 (two) times daily by mouth.  Dispense: 60 capsule; Refill: 5    Meds ordered this encounter  Medications  . albuterol (PROVENTIL HFA;VENTOLIN HFA) 108 (90 Base) MCG/ACT inhaler    Sig: Inhale 2 puffs every 6 (six) hours as needed into the lungs for wheezing or shortness of breath.    Dispense:  8 g    Refill:  2    Order Specific Question:   Supervising Provider    Answer:   Quentin AngstJEGEDE, OLUGBEMIGA E [id number]]  . cyclobenzaprine (FLEXERIL) 10 MG tablet    Sig: Take 1 tablet (10 mg total) 3 (three) times daily as needed by mouth for muscle spasms.    Dispense:  60 tablet    Refill:  2    Order Specific Question:   Supervising Provider    Answer:   Quentin AngstJEGEDE, OLUGBEMIGA E [id number]]  . DULoxetine 40 MG CPEP    Sig: Take 40 mg daily by mouth.    Dispense:  30 capsule    Refill:  11    Order Specific Question:   Supervising Provider    Answer:   Quentin AngstJEGEDE,  OLUGBEMIGA E [id number]]  . amLODipine (NORVASC) 10 MG tablet    Sig: Take 1 tablet (10 mg total) daily by mouth.    Dispense:  30 tablet    Refill:  5    Order Specific Question:   Supervising Provider    Answer:   Quentin AngstJEGEDE, OLUGBEMIGA E [id number]]  . benazepril (LOTENSIN) 40 MG tablet    Sig: Take 1 tablet (40 mg total) daily by mouth.    Dispense:  30 tablet    Refill:  5    Order Specific Question:   Supervising Provider    Answer:   Quentin AngstJEGEDE, OLUGBEMIGA E [id number]]  . chlorthalidone (HYGROTON) 50 MG tablet    Sig: Take 1 tablet (50 mg total) daily by mouth.    Dispense:  30 tablet    Refill:  5    Order Specific Question:   Supervising Provider    Answer:   Quentin AngstJEGEDE, OLUGBEMIGA E [id number]]  . omeprazole (PRILOSEC) 20 MG capsule    Sig: Take 1 capsule (20 mg total) 2 (two) times daily before a meal by mouth. As needed.    Dispense:  60 capsule    Refill:  5    Order Specific Question:   Supervising Provider    Answer:   Quentin AngstJEGEDE, OLUGBEMIGA E [id number]]  . buPROPion (WELLBUTRIN SR) 150 MG 12 hr tablet    Sig: Take 1 tablet (150 mg total) 2 (two) times daily by mouth.    Dispense:  60 tablet    Refill:  11    Order Specific Question:   Supervising Provider    Answer:   Quentin AngstJEGEDE, OLUGBEMIGA E [id number]  . gabapentin (NEURONTIN) 300 MG capsule    Sig: Take 1 capsule (300 mg total) 2 (two) times daily by mouth.    Dispense:  60 capsule    Refill:  5    Order Specific Question:  Supervising Provider    Answer:   Quentin Angst [[id number]]    Follow-up: Return in about 2 weeks (around 08/06/2017) for BP check Stacy.   Lizbeth Bark FNP

## 2017-07-23 NOTE — Patient Instructions (Signed)
Apply for orange card to assist in referral process.   Heart-Healthy Eating Plan Heart-healthy meal planning includes:  Limiting unhealthy fats.  Increasing healthy fats.  Making other small dietary changes.  You may need to talk with your doctor or a diet specialist (dietitian) to create an eating plan that is right for you. What types of fat should I choose?  Choose healthy fats. These include olive oil and canola oil, flaxseeds, walnuts, almonds, and seeds.  Eat more omega-3 fats. These include salmon, mackerel, sardines, tuna, flaxseed oil, and ground flaxseeds. Try to eat fish at least twice each week.  Limit saturated fats. ? Saturated fats are often found in animal products, such as meats, butter, and cream. ? Plant sources of saturated fats include palm oil, palm kernel oil, and coconut oil.  Avoid foods with partially hydrogenated oils in them. These include stick margarine, some tub margarines, cookies, crackers, and other baked goods. These contain trans fats. What general guidelines do I need to follow?  Check food labels carefully. Identify foods with trans fats or high amounts of saturated fat.  Fill one half of your plate with vegetables and green salads. Eat 4-5 servings of vegetables per day. A serving of vegetables is: ? 1 cup of raw leafy vegetables. ?  cup of raw or cooked cut-up vegetables. ?  cup of vegetable juice.  Fill one fourth of your plate with whole grains. Look for the word "whole" as the first word in the ingredient list.  Fill one fourth of your plate with lean protein foods.  Eat 4-5 servings of fruit per day. A serving of fruit is: ? One medium whole fruit. ?  cup of dried fruit. ?  cup of fresh, frozen, or canned fruit. ?  cup of 100% fruit juice.  Eat more foods that contain soluble fiber. These include apples, broccoli, carrots, beans, peas, and barley. Try to get 20-30 g of fiber per day.  Eat more home-cooked food. Eat less  restaurant, buffet, and fast food.  Limit or avoid alcohol.  Limit foods high in starch and sugar.  Avoid fried foods.  Avoid frying your food. Try baking, boiling, grilling, or broiling it instead. You can also reduce fat by: ? Removing the skin from poultry. ? Removing all visible fats from meats. ? Skimming the fat off of stews, soups, and gravies before serving them. ? Steaming vegetables in water or broth.  Lose weight if you are overweight.  Eat 4-5 servings of nuts, legumes, and seeds per week: ? One serving of dried beans or legumes equals  cup after being cooked. ? One serving of nuts equals 1 ounces. ? One serving of seeds equals  ounce or one tablespoon.  You may need to keep track of how much salt or sodium you eat. This is especially true if you have high blood pressure. Talk with your doctor or dietitian to get more information. What foods can I eat? Grains Breads, including [place], white, pita, wheat, raisin, rye, oatmeal, and [place]. Tortillas that are neither fried nor made with lard or trans fat. Low-fat rolls, including hotdog and hamburger buns and English muffins. Biscuits. Muffins. Waffles. Pancakes. Light popcorn. Whole-grain cereals. Flatbread. Melba toast. Pretzels. Breadsticks. Rusks. Low-fat snacks. Low-fat crackers, including oyster, saltine, matzo, graham, animal, and rye. Rice and pasta, including brown rice and pastas that are made with whole wheat. Vegetables All vegetables. Fruits All fruits, but limit coconut. Meats and Other Protein Sources Lean, well-trimmed beef, veal, pork,  and lamb. Chicken and [place] without skin. All fish and shellfish. Wild duck, rabbit, pheasant, and venison. Egg whites or low-cholesterol egg substitutes. Dried beans, peas, lentils, and tofu. Seeds and most nuts. Dairy Low-fat or nonfat cheeses, including ricotta, string, and mozzarella. Skim or 1% milk that is liquid, powdered, or evaporated. Buttermilk that is made with  low-fat milk. Nonfat or low-fat yogurt. Beverages Mineral water. Diet carbonated beverages. Sweets and Desserts Sherbets and fruit ices. Honey, jam, marmalade, jelly, and syrups. Meringues and gelatins. Pure sugar candy, such as hard candy, jelly beans, gumdrops, mints, marshmallows, and small amounts of dark chocolate. MGM MIRAGEngel food cake. Eat all sweets and desserts in moderation. Fats and Oils Nonhydrogenated (trans-free) margarines. Vegetable oils, including soybean, sesame, sunflower, olive, peanut, safflower, corn, canola, and cottonseed. Salad dressings or mayonnaise made with a vegetable oil. Limit added fats and oils that you use for cooking, baking, salads, and as spreads. Other Cocoa powder. Coffee and tea. All seasonings and condiments. The items listed above may not be a complete list of recommended foods or beverages. Contact your dietitian for more options. What foods are not recommended? Grains Breads that are made with saturated or trans fats, oils, or whole milk. Croissants. Butter rolls. Cheese breads. Sweet rolls. Donuts. Buttered popcorn. Chow mein noodles. High-fat crackers, such as cheese or butter crackers. Meats and Other Protein Sources Fatty meats, such as hotdogs, short ribs, sausage, spareribs, bacon, rib eye roast or steak, and mutton. High-fat deli meats, such as salami and bologna. Caviar. Domestic duck and goose. Organ meats, such as kidney, liver, sweetbreads, and heart. Dairy Cream, sour cream, cream cheese, and creamed cottage cheese. Whole-milk cheeses, including blue (bleu), [address], [place], [place], [address], [place], [address], cheddar, [place], and [place]. Whole or 2% milk that is liquid, evaporated, or condensed. Whole buttermilk. Cream sauce or high-fat cheese sauce. Yogurt that is made from whole milk. Beverages Regular sodas and juice drinks with added sugar. Sweets and Desserts Frosting. Pudding. Cookies. Cakes other than angel food cake. Candy that  has milk chocolate or white chocolate, hydrogenated fat, butter, coconut, or unknown ingredients. Buttered syrups. Full-fat ice cream or ice cream drinks. Fats and Oils Gravy that has suet, meat fat, or shortening. Cocoa butter, hydrogenated oils, palm oil, coconut oil, palm kernel oil. These can often be found in baked products, candy, fried foods, nondairy creamers, and whipped toppings. Solid fats and shortenings, including bacon fat, salt pork, lard, and butter. Nondairy cream substitutes, such as coffee creamers and sour cream substitutes. Salad dressings that are made of unknown oils, cheese, or sour cream. The items listed above may not be a complete list of foods and beverages to avoid. Contact your dietitian for more information. This information is not intended to replace advice given to you by your health care provider. Make sure you discuss any questions you have with your health care provider. Document Released: 03/01/2012 Document Revised: 02/06/2016 Document Reviewed: 02/22/2014 Elsevier Interactive Patient Education  [employer]2018 Elsevier Inc.

## 2017-07-23 NOTE — Progress Notes (Signed)
Patient is here for establish care HTN   Patient complains both knee pain everyday

## 2017-07-24 LAB — CBC
HEMOGLOBIN: 16.5 g/dL (ref 13.0–17.7)
Hematocrit: 48.7 % (ref 37.5–51.0)
MCH: 31.7 pg (ref 26.6–33.0)
MCHC: 33.9 g/dL (ref 31.5–35.7)
MCV: 94 fL (ref 79–97)
Platelets: 122 10*3/uL — ABNORMAL LOW (ref 150–379)
RBC: 5.2 x10E6/uL (ref 4.14–5.80)
RDW: 13.6 % (ref 12.3–15.4)
WBC: 6.8 10*3/uL (ref 3.4–10.8)

## 2017-07-24 LAB — CMP AND LIVER
ALT: 30 IU/L (ref 0–44)
AST: 31 IU/L (ref 0–40)
Albumin: 4.4 g/dL (ref 3.5–5.5)
Alkaline Phosphatase: 46 IU/L (ref 39–117)
BILIRUBIN TOTAL: 0.5 mg/dL (ref 0.0–1.2)
BILIRUBIN, DIRECT: 0.17 mg/dL (ref 0.00–0.40)
BUN: 10 mg/dL (ref 6–24)
CHLORIDE: 104 mmol/L (ref 96–106)
CO2: 24 mmol/L (ref 20–29)
Calcium: 9.4 mg/dL (ref 8.7–10.2)
Creatinine, Ser: 0.94 mg/dL (ref 0.76–1.27)
GFR calc Af Amer: 112 mL/min/{1.73_m2} (ref >59)
GFR calc non Af Amer: 97 mL/min/{1.73_m2} (ref >59)
GLUCOSE: 105 mg/dL — AB (ref 65–99)
Potassium: 4.3 mmol/L (ref 3.5–5.2)
Sodium: 141 mmol/L (ref 134–144)
Total Protein: 7.5 g/dL (ref 6.0–8.5)

## 2017-07-24 LAB — LIPID PANEL
CHOLESTEROL TOTAL: 114 mg/dL (ref 100–199)
Chol/HDL Ratio: 3.8 ratio (ref 0.0–5.0)
HDL: 30 mg/dL — AB (ref >39)
LDL CALC: 74 mg/dL (ref 0–99)
TRIGLYCERIDES: 48 mg/dL (ref 0–149)
VLDL CHOLESTEROL CAL: 10 mg/dL (ref 5–40)

## 2017-07-26 ENCOUNTER — Other Ambulatory Visit: Payer: Self-pay | Admitting: Family Medicine

## 2017-07-26 DIAGNOSIS — M25561 Pain in right knee: Secondary | ICD-10-CM

## 2017-07-26 DIAGNOSIS — G8929 Other chronic pain: Secondary | ICD-10-CM

## 2017-07-26 DIAGNOSIS — F331 Major depressive disorder, recurrent, moderate: Secondary | ICD-10-CM

## 2017-07-26 DIAGNOSIS — M25562 Pain in left knee: Secondary | ICD-10-CM

## 2017-07-26 MED ORDER — DULOXETINE HCL 20 MG PO CPEP: 40.0000 mg | ORAL_CAPSULE | Freq: Every day | ORAL | 6 refills | Status: DC

## 2017-07-26 MED FILL — DULoxetine HCL 20 MG CPEP: 20 | 30 days supply | Qty: 60 | Fill #0

## 2017-08-03 ENCOUNTER — Other Ambulatory Visit: Payer: Self-pay | Admitting: Family Medicine

## 2017-08-03 DIAGNOSIS — E786 Lipoprotein deficiency: Secondary | ICD-10-CM | POA: Insufficient documentation

## 2017-08-03 MED ORDER — PRAVASTATIN SODIUM 40 MG PO TABS: 40.0000 mg | ORAL_TABLET | Freq: Every day | ORAL | 2 refills | Status: DC

## 2017-08-10 ENCOUNTER — Telehealth: Payer: Self-pay

## 2017-08-10 NOTE — Telephone Encounter (Signed)
CMA call regarding lab results   Patient Verify DOB   Patient was aware and understood  

## 2017-08-10 NOTE — Telephone Encounter (Signed)
-----   Message from Lizbeth BarkMandesia R Hairston, OregonFNP sent at 08/03/2017  1:45 PM EST ----- Kidney function normal Liver function normal Labs that evaluated your blood cells, fluid and electrolyte balance are normal. No signs of anemia present. Platelet count slightly lowered but stable compared to previous labs. HDL cholesterol levels are low. This can increase your risk of heart disease overtime. You will be prescribed pravastatin to help lower risk. Start eating a diet low in saturated fat. Limit your intake of fried foods, red meats, and whole milk. Increase activity.

## 2017-08-26 ENCOUNTER — Ambulatory Visit: Payer: Self-pay | Admitting: Registered"

## 2017-10-25 ENCOUNTER — Ambulatory Visit: Payer: Self-pay | Admitting: Family Medicine

## 2018-07-05 IMAGING — DX DG LUMBAR SPINE 2-3V
4 series · 4 of 4 positions shown · non-contrast
Comparison: CT abdomen and pelvis 01/18/2016

CLINICAL DATA: Midline low back pain without sciatica.

EXAM:
LUMBAR SPINE - 2-3 VIEW

[l-spine lat]
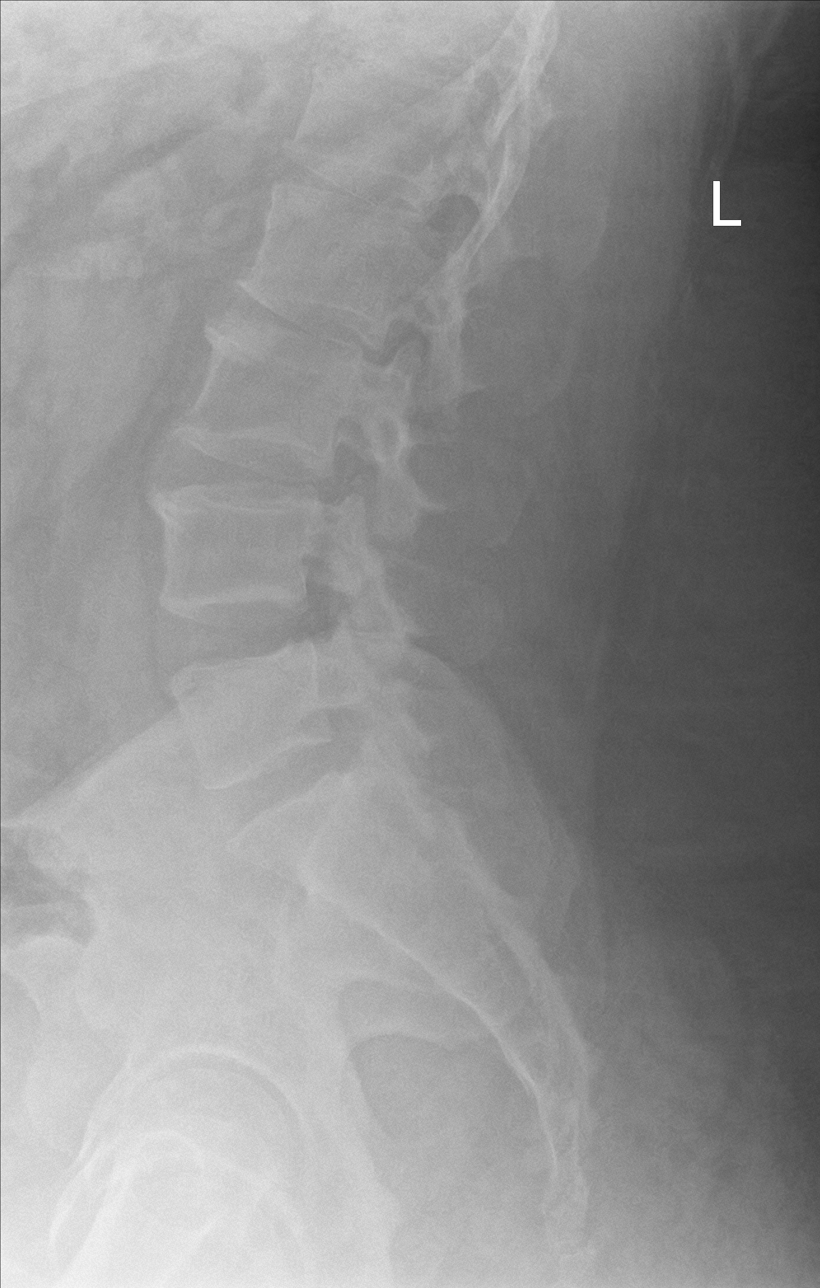

[l-spine spot]
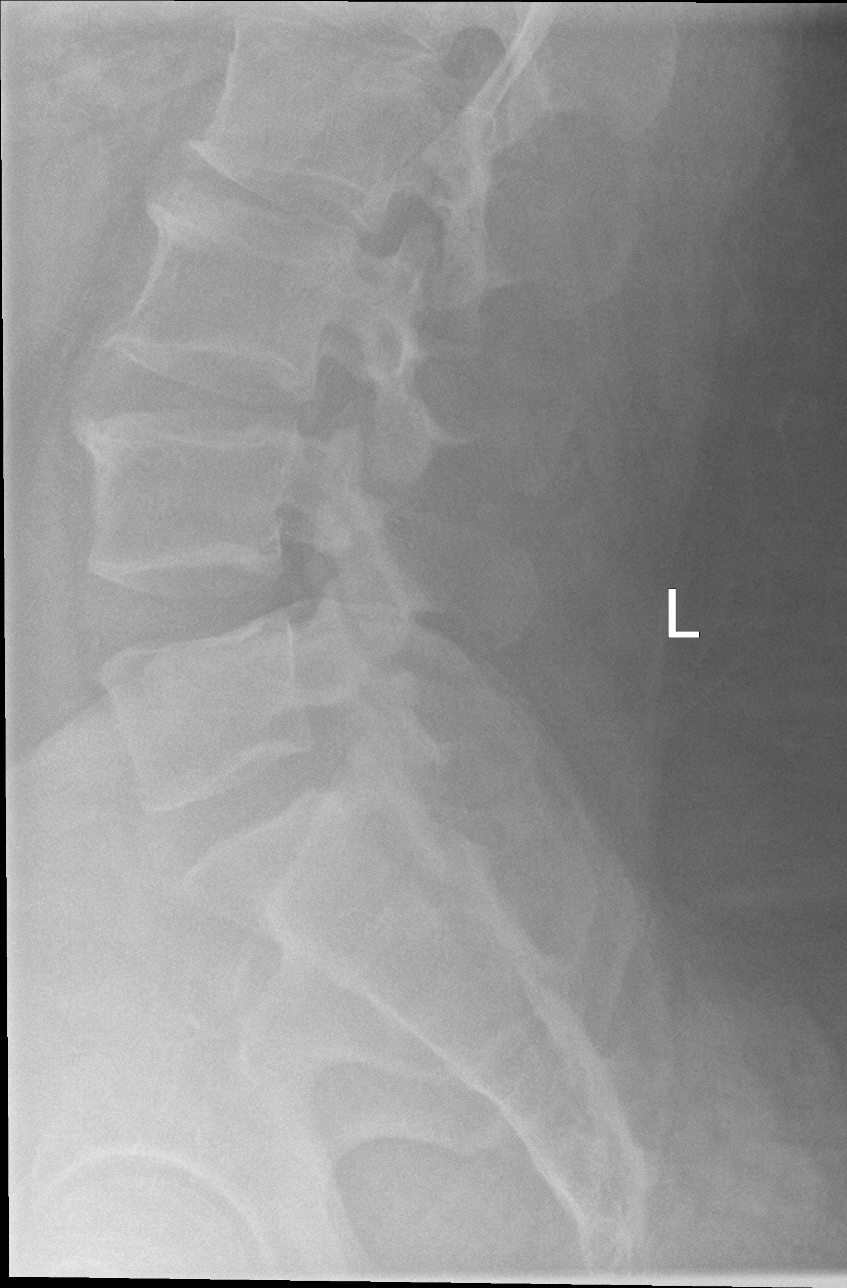

[l-spine ap (1 of 2)]
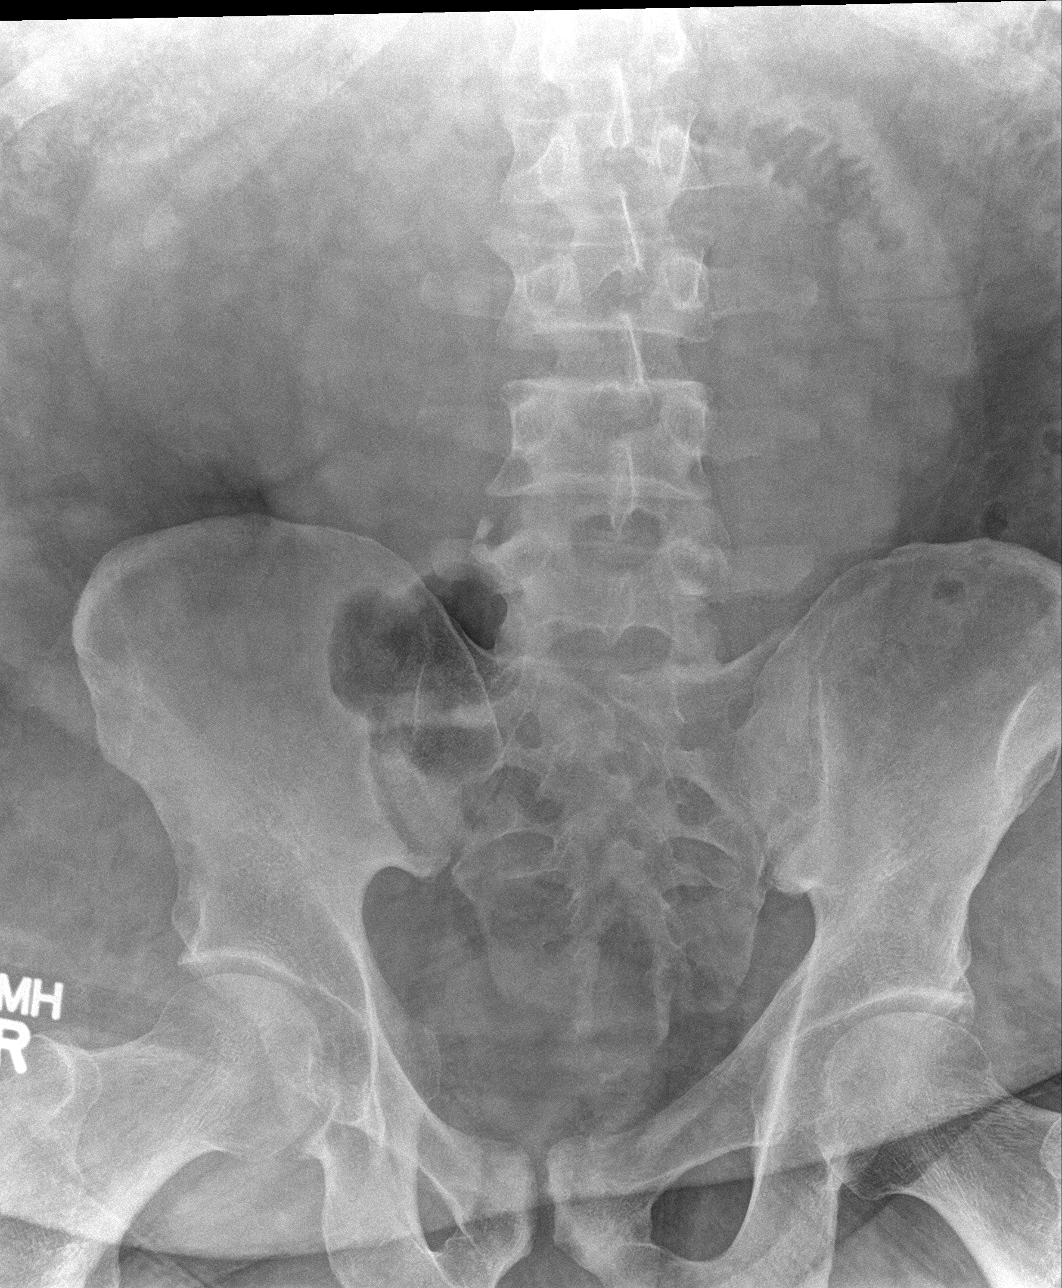

[l-spine ap (2 of 2)]
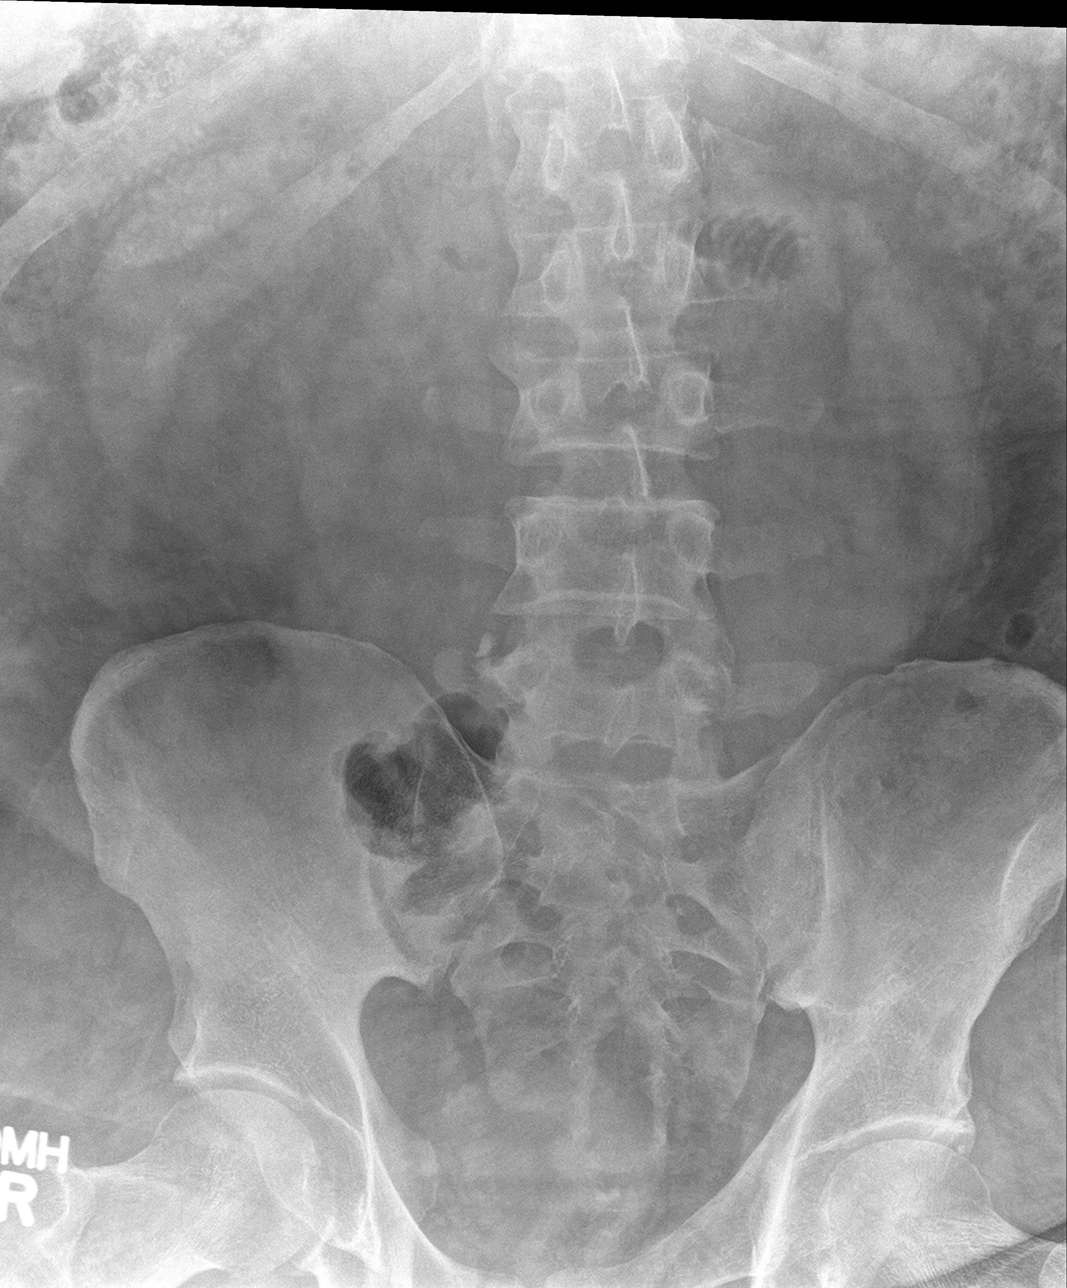

[4 of 4 positions shown; findings below may reference images not displayed]

FINDINGS: There are 5 non rib-bearing lumbar type vertebral bodies. Trace
retrolisthesis of L2 on L3 is unchanged. Vertebral body heights are
preserved without evidence of fracture. Intervertebral disc space
heights are preserved. Anterior endplate spurring is noted at L2-3
and L3-4. There is right lateral annular calcification at L4-5.
IMPRESSION: Mild lumbar spondylosis.

## 2021-06-18 ENCOUNTER — Ambulatory Visit
Admission: EM | Admit: 2021-06-18 | Discharge: 2021-06-18 | Disposition: A | Discharge status: Home / Self Care | Payer: Self-pay | Attending: Physician Assistant | Admitting: Physician Assistant

## 2021-06-18 ENCOUNTER — Other Ambulatory Visit: Payer: Self-pay

## 2021-06-18 DIAGNOSIS — E1159 Type 2 diabetes mellitus with other circulatory complications: Secondary | ICD-10-CM

## 2021-06-18 DIAGNOSIS — I10 Essential (primary) hypertension: Secondary | ICD-10-CM

## 2021-06-18 MED ORDER — AMLODIPINE BESYLATE 5 MG PO TABS: 5.0000 mg | ORAL_TABLET | Freq: Every day | ORAL | 0 refills | Status: DC

## 2021-06-18 NOTE — ED Triage Notes (Signed)
Pt c/o bilat skin changes, darker in color. In addition to bilat blisters to feet. Onset over a month ago.   States not taking medications for HTN or DM mostly r/t costs.

## 2021-06-18 NOTE — Discharge Instructions (Addendum)
It is very important that you follow up with your primary care provider. You may contact podiatry to schedule appointment for further evaluation and treatment of your feet. I have sent in amlodipine to start taking once daily.

## 2021-06-18 NOTE — ED Provider Notes (Signed)
[facility]    CSN: [id number] Arrival date & time: 06/18/21  0944      History   Chief Complaint Chief Complaint  Patient presents with   bilat lower extrimity discoloration and blisters    HPI Ernest Myers is a 50 y.o. male.   Patient here today for evaluation of discoloration to his lower legs and "blisters" to the soles of his feet at his toes that have been present for over a month. He reports that he has known diabetes and hypertension but has not seen a PCP in several years. He is not taking any medications at this time. He does not currently have health insurance and stopped maintenance meds primarily due to cost. He denies any chest pain or headache. He has not had lightheadedness. He does report pain in his legs at times.   The history is provided by the patient.   Past Medical History:  Diagnosis Date   Arthritis    Diabetes mellitus without complication (HCC)    Fatty liver    Hypertension     Patient Active Problem List   Diagnosis Date Noted   High density lipoprotein (HDL) less than 40 mg/dL 70/35/0093   Primary osteoarthritis of both knees 05/21/2017   Needle phobia 03/26/2017   Gastroesophageal reflux disease without esophagitis 03/26/2016   Depression 03/26/2016   Bilateral chronic knee pain 01/10/2016   Right arm pain 05/01/2015   Low back pain 12/03/2014   Paresthesia 12/03/2014   Obesity, morbid, BMI 50 or higher (HCC) 11/11/2006   Essential hypertension, benign 11/11/2006   Sleep apnea 11/11/2006    History reviewed. No pertinent surgical history.     Home Medications    Prior to Admission medications   Medication Sig Start Date End Date Taking? Authorizing Provider  amLODipine (NORVASC) 5 MG tablet Take 1 tablet (5 mg total) by mouth daily. 06/18/21 07/18/21 Yes Tomi Bamberger, PA-C  acetaminophen-codeine (TYLENOL #3) 300-30 MG tablet Take 1 tablet by mouth every 8 (eight) hours as needed for moderate pain. 10/19/16    Funches, Gerilyn Nestle, MD  albuterol (PROVENTIL HFA;VENTOLIN HFA) 108 (90 Base) MCG/ACT inhaler Inhale 2 puffs every 6 (six) hours as needed into the lungs for wheezing or shortness of breath. 07/23/17   Lizbeth Bark, FNP  benazepril (LOTENSIN) 40 MG tablet Take 1 tablet (40 mg total) daily by mouth. 07/23/17   Lizbeth Bark, FNP  buPROPion (WELLBUTRIN SR) 150 MG 12 hr tablet Take 1 tablet (150 mg total) 2 (two) times daily by mouth. 07/23/17   Lizbeth Bark, FNP  chlorthalidone (HYGROTON) 50 MG tablet Take 1 tablet (50 mg total) daily by mouth. 07/23/17   Lizbeth Bark, FNP  cyclobenzaprine (FLEXERIL) 10 MG tablet Take 1 tablet (10 mg total) 3 (three) times daily as needed by mouth for muscle spasms. 07/23/17   Lizbeth Bark, FNP  DULoxetine (CYMBALTA) 20 MG capsule Take 2 capsules (40 mg total) daily by mouth. 07/26/17   Hairston, Oren Beckmann, FNP  gabapentin (NEURONTIN) 300 MG capsule Take 1 capsule (300 mg total) 2 (two) times daily by mouth. 07/23/17   Lizbeth Bark, FNP  Misc. Devices (CANE) MISC 1 each by Does not apply route daily. 02/25/17   Funches, Gerilyn Nestle, MD  naproxen (NAPROSYN) 500 MG tablet Take 1 tablet (500 mg total) by mouth 2 (two) times daily as needed for moderate pain. Take with food 02/25/17   Dessa Phi, MD  omeprazole (PRILOSEC) 20 MG capsule  Take 1 capsule (20 mg total) 2 (two) times daily before a meal by mouth. As needed. 07/23/17   Lizbeth Bark, FNP  pravastatin (PRAVACHOL) 40 MG tablet Take 1 tablet (40 mg total) by mouth daily. 08/03/17   Lizbeth Bark, FNP    Family History Family History  Problem Relation Age of Onset   Diabetes Mother    Heart disease Father     Social History Social History   Tobacco Use   Smoking status: Never   Smokeless tobacco: Never  Substance Use Topics   Alcohol use: Yes    Comment: social   Drug use: No     Allergies   Patient has no known allergies.   Review of  Systems Review of Systems  Constitutional:  Negative for chills and fever.  Eyes:  Negative for discharge and redness.  Respiratory:  Positive for shortness of breath (occasional shortness of breath- not currently). Negative for wheezing.   Cardiovascular:  Negative for chest pain.  Gastrointestinal:  Negative for nausea and vomiting.  Skin:  Positive for color change.  Neurological:  Negative for light-headedness and headaches.    Physical Exam Triage Vital Signs ED Triage Vitals  Enc Vitals Group     BP 06/18/21 0952 (!) 180/105     Pulse Rate 06/18/21 0952 86     Resp 06/18/21 0952 18     Temp 06/18/21 0952 98.1 F (36.7 C)     Temp Source 06/18/21 0952 Oral     SpO2 06/18/21 0952 93 %     Weight --      Height --      Head Circumference --      Peak Flow --      Pain Score 06/18/21 0956 0     Pain Loc --      Pain Edu? --      Excl. in GC? --    No data found.  Updated Vital Signs BP (!) 180/105 (BP Location: Right Arm)   Pulse 86   Temp 98.1 F (36.7 C) (Oral)   Resp 18   SpO2 93%      Physical Exam Vitals and nursing note reviewed.  Constitutional:      General: He is not in acute distress.    Appearance: Normal appearance. He is obese. He is not ill-appearing.  HENT:     Head: Normocephalic and atraumatic.  Eyes:     Conjunctiva/sclera: Conjunctivae normal.  Cardiovascular:     Rate and Rhythm: Normal rate and regular rhythm.     Heart sounds: Normal heart sounds.     Comments: Bilateral lower legs with hyperpigmentation, normal temp, no edema Pulmonary:     Effort: Pulmonary effort is normal. No respiratory distress.     Breath sounds: Normal breath sounds. No wheezing, rhonchi or rales.  Skin:    General: Skin is warm and dry.     Comments: Calluses noted to sole of right foot at 3rd, 4th MTP joints, no significant erythema, swelling noted, no bleeding or discharge  Neurological:     Mental Status: He is alert.  Psychiatric:        Mood and  Affect: Mood normal.        Behavior: Behavior normal.     UC Treatments / Results  Labs (all labs ordered are listed, but only abnormal results are displayed) Labs Reviewed - No data to display  EKG   Radiology No results found.  Procedures Procedures (including  critical care time)  Medications Ordered in UC Medications - No data to display  Initial Impression / Assessment and Plan / UC Course  I have reviewed the triage vital signs and the nursing notes.  Pertinent labs & imaging results that were available during my care of the patient were reviewed by me and considered in my medical decision making (see chart for details).   Will restart amlodipine at 5 mg daily. Strongly urged patient to follow up with PCP, discussed that he would need further maintenance medications, etc to prevent further complications.  Discussed using [facility] to hopefully be able to find his medications cheaper and more affordable. Discussed that I felt his discoloration to his lower legs was likely due to circulatory issues secondary to his uncontrolled chronic issues. I did give some contact info for local podiatrist as well-- the blisters he is concerned about appeared more as calluses, no signs of infection noted. Encouraged follow up with any further concerns.   Final Clinical Impressions(s) / UC Diagnoses   Final diagnoses:  Type 2 diabetes mellitus with other circulatory complication, without long-term current use of insulin (HCC)  Severe uncontrolled hypertension     Discharge Instructions      It is very important that you follow up with your primary care provider. You may contact podiatry to schedule appointment for further evaluation and treatment of your feet. I have sent in amlodipine to start taking once daily.      ED Prescriptions     Medication Sig Dispense Auth. Provider   amLODipine (NORVASC) 5 MG tablet  (Status: Discontinued) Take 1 tablet (5 mg total) by mouth daily. 30  tablet Erma Pinto F, PA-C   amLODipine (NORVASC) 5 MG tablet Take 1 tablet (5 mg total) by mouth daily. 30 tablet Tomi Bamberger, PA-C      PDMP not reviewed this encounter.   Tomi Bamberger, PA-C 06/18/21 1045

## 2023-05-28 ENCOUNTER — Encounter: Payer: Self-pay | Admitting: *Deleted

## 2023-05-28 ENCOUNTER — Other Ambulatory Visit: Payer: Self-pay

## 2023-05-28 ENCOUNTER — Ambulatory Visit
Admission: EM | Admit: 2023-05-28 | Discharge: 2023-05-28 | Disposition: A | Discharge status: Home / Self Care | Payer: 59 | Attending: Physician Assistant | Admitting: Physician Assistant

## 2023-05-28 DIAGNOSIS — L989 Disorder of the skin and subcutaneous tissue, unspecified: Secondary | ICD-10-CM

## 2023-05-28 DIAGNOSIS — R2242 Localized swelling, mass and lump, left lower limb: Secondary | ICD-10-CM

## 2023-05-28 LAB — POCT FASTING CBG KUC MANUAL ENTRY: POCT Glucose (KUC): 147 mg/dL — AB (ref 70–99)

## 2023-05-28 MED ORDER — NAPROXEN 500 MG PO TABS: 500.0000 mg | ORAL_TABLET | Freq: Two times a day (BID) | ORAL | 0 refills | Status: DC

## 2023-05-28 NOTE — ED Notes (Signed)
New Patient/PCP Visit provided. MyChart to be setup "@ home" when "Wifi" can be used.

## 2023-05-28 NOTE — ED Provider Notes (Signed)
[facility]    CSN: [id number] Arrival date & time: 05/28/23  0808      History   Chief Complaint Chief Complaint  Patient presents with   Foot Pain    HPI Ernest Myers is a 52 y.o. male.   Patient here today for evaluation of swelling to the top of his left foot that started about 4 months ago.  He states that swelling has not worsened or improved in that time.  He notes he is unable to put on the tissue due to swelling and pain.  He also reports skin lesions to bilateral feet.  He has had similar present in the past.  He notes he has taken ibuprofen for the pain in his foot with mild relief.  He denies any numbness or tingling.  He does not report any known injury.  The history is provided by the patient.  Foot Pain    Past Medical History:  Diagnosis Date   Arthritis    Diabetes mellitus without complication (HCC)    Fatty liver    Hypertension     Patient Active Problem List   Diagnosis Date Noted   High density lipoprotein (HDL) less than 40 mg/dL 57/84/6962   Primary osteoarthritis of both knees 05/21/2017   Needle phobia 03/26/2017   Gastroesophageal reflux disease without esophagitis 03/26/2016   Depression 03/26/2016   Bilateral chronic knee pain 01/10/2016   Right arm pain 05/01/2015   Low back pain 12/03/2014   Paresthesia 12/03/2014   Obesity, morbid, BMI 50 or higher (HCC) 11/11/2006   Essential hypertension, benign 11/11/2006   Sleep apnea 11/11/2006    History reviewed. No pertinent surgical history.     Home Medications    Prior to Admission medications   Medication Sig Start Date End Date Taking? Authorizing Provider  naproxen (NAPROSYN) 500 MG tablet Take 1 tablet (500 mg total) by mouth 2 (two) times daily. 05/28/23  Yes Tomi Bamberger, PA-C  acetaminophen-codeine (TYLENOL #3) 300-30 MG tablet Take 1 tablet by mouth every 8 (eight) hours as needed for moderate pain. 10/19/16   Funches, Gerilyn Nestle, MD  albuterol (PROVENTIL  HFA;VENTOLIN HFA) 108 (90 Base) MCG/ACT inhaler Inhale 2 puffs every 6 (six) hours as needed into the lungs for wheezing or shortness of breath. 07/23/17   Lizbeth Bark, FNP  amLODipine (NORVASC) 5 MG tablet Take 1 tablet (5 mg total) by mouth daily. 06/18/21 07/18/21  Tomi Bamberger, PA-C  benazepril (LOTENSIN) 40 MG tablet Take 1 tablet (40 mg total) daily by mouth. 07/23/17   Lizbeth Bark, FNP  buPROPion (WELLBUTRIN SR) 150 MG 12 hr tablet Take 1 tablet (150 mg total) 2 (two) times daily by mouth. 07/23/17   Lizbeth Bark, FNP  chlorthalidone (HYGROTON) 50 MG tablet Take 1 tablet (50 mg total) daily by mouth. 07/23/17   Lizbeth Bark, FNP  cyclobenzaprine (FLEXERIL) 10 MG tablet Take 1 tablet (10 mg total) 3 (three) times daily as needed by mouth for muscle spasms. 07/23/17   Lizbeth Bark, FNP  DULoxetine (CYMBALTA) 20 MG capsule Take 2 capsules (40 mg total) daily by mouth. 07/26/17   Hairston, Oren Beckmann, FNP  gabapentin (NEURONTIN) 300 MG capsule Take 1 capsule (300 mg total) 2 (two) times daily by mouth. 07/23/17   Lizbeth Bark, FNP  Misc. Devices (CANE) MISC 1 each by Does not apply route daily. 02/25/17   Funches, Gerilyn Nestle, MD  omeprazole (PRILOSEC) 20 MG capsule Take 1 capsule (  20 mg total) 2 (two) times daily before a meal by mouth. As needed. 07/23/17   Lizbeth Bark, FNP  pravastatin (PRAVACHOL) 40 MG tablet Take 1 tablet (40 mg total) by mouth daily. 08/03/17   Lizbeth Bark, FNP    Family History Family History  Problem Relation Age of Onset   Diabetes Mother    Heart disease Father     Social History Social History   Tobacco Use   Smoking status: Never   Smokeless tobacco: Never  Vaping Use   Vaping status: Never Used  Substance Use Topics   Alcohol use: Yes    Comment: social- 2-3 days/wk   Drug use: No     Allergies   Patient has no known allergies.   Review of Systems Review of Systems  Constitutional:   Negative for chills and fever.  Eyes:  Negative for discharge and redness.  Musculoskeletal:  Negative for arthralgias.  Skin:  Negative for color change and wound.  Neurological:  Negative for numbness.     Physical Exam Triage Vital Signs ED Triage Vitals [05/28/23 0836]  Encounter Vitals Group     BP      Systolic BP Percentile      Diastolic BP Percentile      Pulse      Resp      Temp      Temp src      SpO2      Weight      Height      Head Circumference      Peak Flow      Pain Score 10     Pain Loc      Pain Education      Exclude from Growth Chart    No data found.  Updated Vital Signs BP (!) 157/92 (BP Location: Left Arm)   Pulse 86   Temp 98.7 F (37.1 C) (Oral)   Resp 20   SpO2 92%     Physical Exam Vitals and nursing note reviewed.  Constitutional:      General: He is not in acute distress.    Appearance: Normal appearance. He is not ill-appearing.  HENT:     Head: Normocephalic and atraumatic.  Eyes:     Conjunctiva/sclera: Conjunctivae normal.  Cardiovascular:     Rate and Rhythm: Normal rate.  Pulmonary:     Effort: Pulmonary effort is normal. No respiratory distress.  Musculoskeletal:     Comments: Mild diffuse slightly compressible localized swelling noted to dorsal left midfoot with no significant tenderness to palpation, warmth or erythema.  Skin:    Comments: Multiple calluses noted to bilateral feet.  Neurological:     Mental Status: He is alert.  Psychiatric:        Mood and Affect: Mood normal.        Behavior: Behavior normal.        Thought Content: Thought content normal.      UC Treatments / Results  Labs (all labs ordered are listed, but only abnormal results are displayed) Labs Reviewed  POCT FASTING CBG [facility] MANUAL ENTRY - Abnormal; Notable for the following components:      Result Value   POCT Glucose ([facility]) 147 (*)    All other components within normal limits    EKG   Radiology No results  found.  Procedures Procedures (including critical care time)  Medications Ordered in UC Medications - No data to display  Initial Impression / Assessment and  Plan / UC Course  I have reviewed the triage vital signs and the nursing notes.  Pertinent labs & imaging results that were available during my care of the patient were reviewed by me and considered in my medical decision making (see chart for details).    Patient set up with primary care in office today as he reports he does not have a PCP for follow-up.  CBG in office was not extremely elevated, however unclear how uncontrolled diabetes is as he has not had this followed recently.  Also recommended follow-up with podiatry and contact information given for [facility] regarding foot swelling and calluses.  Will treat with naproxen in the meantime and encouraged sooner follow-up with any concerns.  Final Clinical Impressions(s) / UC Diagnoses   Final diagnoses:  Localized swelling of left foot  Lesion of skin of foot   Discharge Instructions   None    ED Prescriptions     Medication Sig Dispense Auth. Provider   naproxen (NAPROSYN) 500 MG tablet Take 1 tablet (500 mg total) by mouth 2 (two) times daily. 30 tablet Tomi Bamberger, PA-C      PDMP not reviewed this encounter.   Tomi Bamberger, PA-C 05/28/23 [phone]

## 2023-05-28 NOTE — ED Triage Notes (Signed)
Pt has area of swelling to top of left foot x 4 months. States it is painful and he is unable to wear a tennis shoe. Also has patches of scaly skin on both feet and left hand

## 2023-08-03 ENCOUNTER — Encounter: Payer: 59 | Admitting: Family

## 2023-08-03 ENCOUNTER — Telehealth: Payer: Self-pay

## 2023-08-03 NOTE — Progress Notes (Signed)
Erroneous encounter-disregard

## 2023-08-03 NOTE — Telephone Encounter (Signed)
Called pt to rescheduled missed NP appt at [facility]; could not reach or leave vm

## 2024-02-22 ENCOUNTER — Emergency Department (HOSPITAL_COMMUNITY): Admission: EM | Admit: 2024-02-22 | Discharge: 2024-02-22 | Disposition: A | Discharge status: Home / Self Care

## 2024-02-22 ENCOUNTER — Emergency Department (HOSPITAL_COMMUNITY)

## 2024-02-22 ENCOUNTER — Emergency Department (HOSPITAL_BASED_OUTPATIENT_CLINIC_OR_DEPARTMENT_OTHER)

## 2024-02-22 ENCOUNTER — Encounter (HOSPITAL_COMMUNITY): Payer: Self-pay

## 2024-02-22 ENCOUNTER — Other Ambulatory Visit: Payer: Self-pay

## 2024-02-22 DIAGNOSIS — R609 Edema, unspecified: Secondary | ICD-10-CM | POA: Diagnosis not present

## 2024-02-22 DIAGNOSIS — I10 Essential (primary) hypertension: Secondary | ICD-10-CM | POA: Insufficient documentation

## 2024-02-22 DIAGNOSIS — I2510 Atherosclerotic heart disease of native coronary artery without angina pectoris: Secondary | ICD-10-CM | POA: Diagnosis not present

## 2024-02-22 DIAGNOSIS — R600 Localized edema: Secondary | ICD-10-CM | POA: Insufficient documentation

## 2024-02-22 DIAGNOSIS — R2243 Localized swelling, mass and lump, lower limb, bilateral: Secondary | ICD-10-CM | POA: Diagnosis present

## 2024-02-22 DIAGNOSIS — E119 Type 2 diabetes mellitus without complications: Secondary | ICD-10-CM | POA: Insufficient documentation

## 2024-02-22 LAB — CBC WITH DIFFERENTIAL/PLATELET
Abs Immature Granulocytes: 0.03 10*3/uL (ref 0.00–0.07)
Basophils Absolute: 0.1 10*3/uL (ref 0.0–0.1)
Basophils Relative: 1 %
Eosinophils Absolute: 0.2 10*3/uL (ref 0.0–0.5)
Eosinophils Relative: 3 %
HCT: 47.6 % (ref 39.0–52.0)
Hemoglobin: 15.9 g/dL (ref 13.0–17.0)
Immature Granulocytes: 0 %
Lymphocytes Relative: 29 %
Lymphs Abs: 2.1 10*3/uL (ref 0.7–4.0)
MCH: 32.6 pg (ref 26.0–34.0)
MCHC: 33.4 g/dL (ref 30.0–36.0)
MCV: 97.5 fL (ref 80.0–100.0)
Monocytes Absolute: 0.5 10*3/uL (ref 0.1–1.0)
Monocytes Relative: 7 %
Neutro Abs: 4.3 10*3/uL (ref 1.7–7.7)
Neutrophils Relative %: 60 %
Platelets: 99 10*3/uL — ABNORMAL LOW (ref 150–400)
RBC: 4.88 MIL/uL (ref 4.22–5.81)
RDW: 13.3 % (ref 11.5–15.5)
WBC: 7.1 10*3/uL (ref 4.0–10.5)
nRBC: 0 % (ref 0.0–0.2)

## 2024-02-22 LAB — COMPREHENSIVE METABOLIC PANEL WITH GFR
ALT: 30 U/L (ref 0–44)
AST: 31 U/L (ref 15–41)
Albumin: 3.6 g/dL (ref 3.5–5.0)
Alkaline Phosphatase: 34 U/L — ABNORMAL LOW (ref 38–126)
Anion gap: 9 (ref 5–15)
BUN: 13 mg/dL (ref 6–20)
CO2: 23 mmol/L (ref 22–32)
Calcium: 8.6 mg/dL — ABNORMAL LOW (ref 8.9–10.3)
Chloride: 105 mmol/L (ref 98–111)
Creatinine, Ser: 0.7 mg/dL (ref 0.61–1.24)
GFR, Estimated: >60 mL/min (ref >60)
Glucose, Bld: 134 mg/dL — ABNORMAL HIGH (ref 70–99)
Potassium: 4 mmol/L (ref 3.5–5.1)
Sodium: 137 mmol/L (ref 135–145)
Total Bilirubin: 0.8 mg/dL (ref 0.0–1.2)
Total Protein: 7.5 g/dL (ref 6.5–8.1)

## 2024-02-22 LAB — CBG MONITORING, ED: Glucose-Capillary: 135 mg/dL — ABNORMAL HIGH (ref 70–99)

## 2024-02-22 LAB — BRAIN NATRIURETIC PEPTIDE: B Natriuretic Peptide: 68.5 pg/mL (ref 0.0–100.0)

## 2024-02-22 LAB — TROPONIN I (HIGH SENSITIVITY)
Troponin I (High Sensitivity): 7 ng/L (ref <18)
Troponin I (High Sensitivity): 6 ng/L (ref <18)

## 2024-02-22 MED ORDER — IOHEXOL 350 MG/ML SOLN
75.0000 mL | Freq: Once | INTRAVENOUS | Status: AC | PRN
  Administered 2024-02-22: 75 mL via INTRAVENOUS

## 2024-02-22 NOTE — ED Notes (Signed)
 EKG performed.

## 2024-02-22 NOTE — ED Provider Notes (Signed)
 [place] [facility] Provider Note   CSN: [id number] Arrival date & time: 02/22/24  0555     History  Chief Complaint  Patient presents with   Chest Pain   Leg Swelling    Ernest Myers is a 53 y.o. male.  53 year old male with past medical history of diabetes and hypertension presenting to the emergency department today with chest pain, leg swelling, and shortness of breath.  The patient states that this been going now for the past few weeks.  He states that he has had bilateral lower extremity swelling.  He states that he has had shortness of breath with exertion.  Denies any orthopnea.  He denies any pleuritic chest pain.  States that he does have occasional sharp chest pain.  He denies any hemoptysis.  Denies a history of DVT or pulmonary embolism, recent surgeries, recent travel.  He came to the emergency department today for further evaluation regarding this due to ongoing symptoms.   Chest Pain Associated symptoms: shortness of breath        Home Medications Prior to Admission medications   Not on File      Allergies    Patient has no known allergies.    Review of Systems   Review of Systems  Respiratory:  Positive for shortness of breath.   Cardiovascular:  Positive for chest pain.  All other systems reviewed and are negative.   Physical Exam Updated Vital Signs BP 130/79   Pulse 70   Temp 98.5 F (36.9 C) (Oral)   Resp (!) 22   Ht 5\' 11"  (1.803 m)   Wt (!) 176.9 kg   SpO2 96%   BMI 54.39 kg/m  Physical Exam Vitals and nursing note reviewed.   Gen: Mild conversational dyspnea noted but speaking in full sentences Eyes: PERRL, EOMI HEENT: no oropharyngeal swelling Neck: trachea midline Resp: Diminished at bilateral lung bases Card: RRR, no murmurs, rubs, or gallops Abd: nontender, nondistended Extremities: no calf tenderness, no edema Vascular: 2+ radial pulses bilaterally, 2+ DP pulses bilaterally Skin: no  rashes Psyc: acting appropriately   ED Results / Procedures / Treatments   Labs (all labs ordered are listed, but only abnormal results are displayed) Labs Reviewed  CBC WITH DIFFERENTIAL/PLATELET - Abnormal; Notable for the following components:      Result Value   Platelets 99 (*)    All other components within normal limits  COMPREHENSIVE METABOLIC PANEL WITH GFR - Abnormal; Notable for the following components:   Glucose, Bld 134 (*)    Calcium 8.6 (*)    Alkaline Phosphatase 34 (*)    All other components within normal limits  CBG MONITORING, ED - Abnormal; Notable for the following components:   Glucose-Capillary 135 (*)    All other components within normal limits  BRAIN NATRIURETIC PEPTIDE  CBG MONITORING, ED  TROPONIN I (HIGH SENSITIVITY)  TROPONIN I (HIGH SENSITIVITY)    EKG EKG Interpretation Date/Time:  Tuesday February 22 2024 06:00:43 EDT Ventricular Rate:  83 PR Interval:  130 QRS Duration:  94 QT Interval:  378 QTC Calculation: 444 R Axis:   103  Text Interpretation: Normal sinus rhythm Rightward axis Incomplete right bundle branch block Septal infarct , age undetermined T wave abnormality, consider inferior ischemia Abnormal ECG When compared with ECG of 11-Apr-2013 08:14, PREVIOUS ECG IS PRESENT Similar to previous morphology Confirmed by Abner Hoffman [phone]) on 02/22/2024 7:06:31 AM  Radiology VAS US  LOWER EXTREMITY VENOUS (DVT) (ONLY  [facility]) Result Date: 02/22/2024  Lower Venous DVT Study Patient Name:  Ernest Myers  Date of Exam:   02/22/2024 Medical Rec #: [id number]       Accession #:    [id number] Date of Birth: 1971/04/17       Patient Gender: M Patient Age:   27 years Exam Location:  [facility] Procedure:      VAS US  LOWER EXTREMITY VENOUS (DVT) Referring Phys: Debria Fang Jguadalupe Opiela --------------------------------------------------------------------------------  Indications: Edema.  Limitations: Body habitus and poor ultrasound/tissue interface. Comparison  Study: No previous exams Performing Technologist: Jody Hill RVT, RDMS  Examination Guidelines: A complete evaluation includes B-mode imaging, spectral Doppler, color Doppler, and power Doppler as needed of all accessible portions of each vessel. Bilateral testing is considered an integral part of a complete examination. Limited examinations for reoccurring indications may be performed as noted. The reflux portion of the exam is performed with the patient in reverse Trendelenburg.  +---------+---------------+---------+-----------+----------+-------------------+ RIGHT    CompressibilityPhasicitySpontaneityPropertiesThrombus Aging      +---------+---------------+---------+-----------+----------+-------------------+ CFV      Full           Yes      Yes                                      +---------+---------------+---------+-----------+----------+-------------------+ SFJ      Full                                                             +---------+---------------+---------+-----------+----------+-------------------+ FV Prox  Full           Yes      Yes                                      +---------+---------------+---------+-----------+----------+-------------------+ FV Mid   Full           Yes      Yes                                      +---------+---------------+---------+-----------+----------+-------------------+ FV DistalFull           Yes      Yes                                      +---------+---------------+---------+-----------+----------+-------------------+ PFV      Full                                                             +---------+---------------+---------+-----------+----------+-------------------+ POP      Full           Yes      Yes                                      +---------+---------------+---------+-----------+----------+-------------------+  PTV      Full                                                              +---------+---------------+---------+-----------+----------+-------------------+ PERO                                                  Not well visualized +---------+---------------+---------+-----------+----------+-------------------+   +---------+---------------+---------+-----------+----------+-------------------+ LEFT     CompressibilityPhasicitySpontaneityPropertiesThrombus Aging      +---------+---------------+---------+-----------+----------+-------------------+ CFV      Full           Yes      Yes                                      +---------+---------------+---------+-----------+----------+-------------------+ SFJ      Full                                                             +---------+---------------+---------+-----------+----------+-------------------+ FV Prox  Full           Yes      Yes                                      +---------+---------------+---------+-----------+----------+-------------------+ FV Mid   Full           Yes      Yes                                      +---------+---------------+---------+-----------+----------+-------------------+ FV DistalFull           Yes      Yes                                      +---------+---------------+---------+-----------+----------+-------------------+ PFV      Full                                                             +---------+---------------+---------+-----------+----------+-------------------+ POP      Full           Yes      Yes                                      +---------+---------------+---------+-----------+----------+-------------------+ PTV      Full                                                             +---------+---------------+---------+-----------+----------+-------------------+  PERO                                                  Not well visualized +---------+---------------+---------+-----------+----------+-------------------+     Summary: BILATERAL: - No evidence of deep vein thrombosis seen in the lower extremities, bilaterally. -No evidence of popliteal cyst, bilaterally.   *See table(s) above for measurements and observations.    Preliminary    CT Angio Chest PE W and/or Wo Contrast Result Date: 02/22/2024 CLINICAL DATA:  Chest pain. EXAM: CT ANGIOGRAPHY CHEST WITH CONTRAST TECHNIQUE: Multidetector CT imaging of the chest was performed using the standard protocol during bolus administration of intravenous contrast. Multiplanar CT image reconstructions and MIPs were obtained to evaluate the vascular anatomy. RADIATION DOSE REDUCTION: This exam was performed according to the departmental dose-optimization program which includes automated exposure control, adjustment of the mA and/or kV according to patient size and/or use of iterative reconstruction technique. CONTRAST:  75mL OMNIPAQUE IOHEXOL 350 MG/ML SOLN COMPARISON:  None Available. FINDINGS: Cardiovascular: Satisfactory opacification of the pulmonary arteries to the segmental level. No evidence of pulmonary embolism. Normal heart size. No pericardial effusion. Coronary artery calcifications are noted. Mediastinum/Nodes: No enlarged mediastinal, hilar, or axillary lymph nodes. Thyroid gland, trachea, and esophagus demonstrate no significant findings. Lungs/Pleura: Lungs are clear. No pleural effusion or pneumothorax. Upper Abdomen: No acute abnormality. Musculoskeletal: No chest wall abnormality. No acute or significant osseous findings. Review of the MIP images confirms the above findings. IMPRESSION: No definite evidence of pulmonary embolus. Coronary artery calcifications are noted suggesting coronary artery disease. Electronically Signed   By: Rosalene Colon M.D.   On: 02/22/2024 11:20   DG Chest 2 View Result Date: 02/22/2024 CLINICAL DATA:  Shortness of breath and chest pain EXAM: CHEST - 2 VIEW COMPARISON:  01/25/2014 FINDINGS: The cardio pericardial silhouette is  enlarged. There is pulmonary vascular congestion without overt pulmonary edema. The lungs are clear without focal pneumonia, edema, pneumothorax or pleural effusion. No acute bony abnormality. IMPRESSION: Enlargement of the cardiopericardial silhouette with pulmonary vascular congestion. Electronically Signed   By: Donnal Fusi M.D.   On: 02/22/2024 06:38    Procedures Procedures    Medications Ordered in ED Medications  iohexol (OMNIPAQUE) 350 MG/ML injection 75 mL (75 mLs Intravenous Contrast Given 02/22/24 1108)    ED Course/ Medical Decision Making/ A&P                                 Medical Decision Making 53 year old male with past medical history of diabetes and hypertension presenting to the emergency department today with chest pain and shortness of breath.  The patient does have significant edema here on the evaluation.  I will further evaluate him here with basic lab as well as an EKG, chest x-ray, troponin, and BNP for further evaluation for ACS, pulmonary edema, pulmonary infiltrates, or pneumothorax.  The patient is not tachycardic here and he denies any significant pleuritic chest pain so suspicion for pulmonary embolism is low at this time.  I will reevaluate for ultimate disposition.  Based on description of his symptoms suspicion for aortic dissection is low at this time.  The patient's labs were reassuring.  His x-ray showed some cardiomegaly but no other acute findings.  Given the complaint of the shortness of breath  and lower extremity swelling ultrasounds were ordered to evaluate for DVT and a CT angiogram was ordered to evaluate for pulmonary embolism as well as possible pericardial effusion given the cardiomegaly.  His CT scan did not really show any concerning findings.  There were some coronary artery calcifications.  This was discussed with the patient.  Troponins are negative so this does not seem consistent with ACS.  I think the patient is stable for discharge.  He  is discharged with cardiology follow-up with return precautions.  Encouraged to start using compression stockings for his lower extremity swelling.  Amount and/or Complexity of Data Reviewed Radiology: ordered.  Risk Prescription drug management.           Final Clinical Impression(s) / ED Diagnoses Final diagnoses:  Coronary artery calcification  Lower extremity edema    Rx / DC Orders ED Discharge Orders          Ordered    Ambulatory referral to Cardiology       Comments: If you have not heard from the Cardiology office within the next 72 hours please call [phone].   02/22/24 1236              Carin Charleston, MD 02/22/24 1238

## 2024-02-22 NOTE — ED Notes (Signed)
 Patient transported to CT

## 2024-02-22 NOTE — ED Notes (Signed)
 Pt RA SPO2 97%. WHILE AMBULATING SPO2 97% HR 94. TOLERATED WELL

## 2024-02-22 NOTE — Progress Notes (Signed)
 BLE venous duplex has been completed.  Preliminary results given to Dr. Charlee Conine.   Results can be found under chart review under CV PROC. 02/22/2024 12:22 PM Honour Schwieger RVT, RDMS

## 2024-02-22 NOTE — Discharge Instructions (Signed)
 Your CT scan did not blood clots but did show some coronary artery disease.  I have placed a referral to cardiology.  You should receive a call in the next few days for further evaluation.  Please follow-up with them and return to the emergency department for worsening symptoms.  Try to pick up some compression socks at the pharmacy and use these to help with the leg swelling.  Return to the emergency department for worsening symptoms.

## 2024-02-22 NOTE — ED Notes (Signed)
 Patient transported to Ultrasound

## 2024-02-22 NOTE — ED Triage Notes (Signed)
 Patient reports chest pain started Sunday and leg swelling.  Reports he stopped taking all his meds.  Denies pain radiating anywhere but does have sob.  No sweating nausea dizzy or sweating.

## 2024-04-04 ENCOUNTER — Encounter (HOSPITAL_COMMUNITY): Payer: Self-pay

## 2024-04-04 ENCOUNTER — Encounter: Payer: Self-pay | Admitting: Emergency Medicine

## 2024-04-04 ENCOUNTER — Emergency Department (HOSPITAL_COMMUNITY)

## 2024-04-04 ENCOUNTER — Emergency Department (HOSPITAL_COMMUNITY): Admission: EM | Admit: 2024-04-04 | Discharge: 2024-04-04 | Disposition: A | Discharge status: Home / Self Care

## 2024-04-04 ENCOUNTER — Other Ambulatory Visit: Payer: Self-pay

## 2024-04-04 ENCOUNTER — Ambulatory Visit
Admission: EM | Admit: 2024-04-04 | Discharge: 2024-04-04 | Disposition: A | Discharge status: Home / Self Care | Source: Home / Self Care

## 2024-04-04 DIAGNOSIS — E119 Type 2 diabetes mellitus without complications: Secondary | ICD-10-CM | POA: Diagnosis not present

## 2024-04-04 DIAGNOSIS — I10 Essential (primary) hypertension: Secondary | ICD-10-CM | POA: Insufficient documentation

## 2024-04-04 DIAGNOSIS — R0789 Other chest pain: Secondary | ICD-10-CM | POA: Diagnosis not present

## 2024-04-04 DIAGNOSIS — R079 Chest pain, unspecified: Secondary | ICD-10-CM

## 2024-04-04 LAB — CBC
HCT: 41.8 % (ref 39.0–52.0)
Hemoglobin: 14.5 g/dL (ref 13.0–17.0)
MCH: 32.9 pg (ref 26.0–34.0)
MCHC: 34.7 g/dL (ref 30.0–36.0)
MCV: 94.8 fL (ref 80.0–100.0)
Platelets: 86 K/uL — ABNORMAL LOW (ref 150–400)
RBC: 4.41 MIL/uL (ref 4.22–5.81)
RDW: 13.8 % (ref 11.5–15.5)
WBC: 6.6 K/uL (ref 4.0–10.5)
nRBC: 0 % (ref 0.0–0.2)

## 2024-04-04 LAB — BASIC METABOLIC PANEL WITH GFR
Anion gap: 11 (ref 5–15)
BUN: 11 mg/dL (ref 6–20)
CO2: 25 mmol/L (ref 22–32)
Calcium: 8.9 mg/dL (ref 8.9–10.3)
Chloride: 103 mmol/L (ref 98–111)
Creatinine, Ser: 0.8 mg/dL (ref 0.61–1.24)
GFR, Estimated: >60 mL/min (ref >60)
Glucose, Bld: 142 mg/dL — ABNORMAL HIGH (ref 70–99)
Potassium: 3.7 mmol/L (ref 3.5–5.1)
Sodium: 139 mmol/L (ref 135–145)

## 2024-04-04 LAB — TROPONIN I (HIGH SENSITIVITY)
Troponin I (High Sensitivity): 7 ng/L (ref <18)
Troponin I (High Sensitivity): 6 ng/L (ref <18)

## 2024-04-04 MED ORDER — KETOROLAC TROMETHAMINE 15 MG/ML IJ SOLN
15.0000 mg | Freq: Once | INTRAMUSCULAR | Status: AC
  Administered 2024-04-04: 15 mg via INTRAVENOUS
  Filled 2024-04-04: qty 1

## 2024-04-04 NOTE — ED Triage Notes (Signed)
 Pt here for intermittent mid to left sided CP with some pain into neck that started today; pt with swelling in legs x months; seen in ED for same and told to follow up with cardiology but pt has not completed yet

## 2024-04-04 NOTE — ED Provider Notes (Signed)
 Signout from Gilbert, PA-C.  In short patient is a 53 year old male with history of hypertension, obesity, OSA, GERD, hyperlipidemia who presents with complaints of chest pain.  Not associated with shortness of breath, nonpleuritic.  Symptoms started earlier today but he had similar symptoms about a month ago.  Was evaluated at that time workup including PE study was reassuring.  EKG today appears unchanged.  At time of signout his lab work is without significant abnormality, chest x-ray with mild vascular congestion, no acute abnormality and his delta troponin is pending. Physical Exam  BP (!) 150/77 (BP Location: Right Arm)   Pulse 80   Temp 98.9 F (37.2 C) (Oral)   Resp 16   Ht 5' 10 (1.778 m)   Wt (!) 181.4 kg   SpO2 98%   BMI 57.39 kg/m   Physical Exam Vitals and nursing note reviewed.  Constitutional:      General: He is not in acute distress.    Appearance: He is well-developed. He is obese.  HENT:     Head: Normocephalic and atraumatic.  Eyes:     Conjunctiva/sclera: Conjunctivae normal.  Cardiovascular:     Rate and Rhythm: Normal rate and regular rhythm.     Heart sounds: No murmur heard. Pulmonary:     Effort: Pulmonary effort is normal. No respiratory distress.     Breath sounds: Normal breath sounds.  Abdominal:     Palpations: Abdomen is soft.     Tenderness: There is no abdominal tenderness.  Musculoskeletal:        General: Swelling present.     Cervical back: Neck supple.     Comments: Trace bilateral pitting edema  Skin:    General: Skin is warm and dry.     Capillary Refill: Capillary refill takes less than 2 seconds.  Neurological:     Mental Status: He is alert.  Psychiatric:        Mood and Affect: Mood normal.     Procedures  Procedures  ED Course / MDM    Medical Decision Making Amount and/or Complexity of Data Reviewed Labs: ordered. Radiology: ordered.  Risk Prescription drug management.   Second troponin remains flat.  Patient's  vitals have remained stable.  He is asymptomatic at this time.  Patient amenable to discharge with PCP follow-up.  Patient will be discharged home. The patient has been appropriately medically screened and/or stabilized in the ED. I have low suspicion for any other emergent medical condition which would require further screening, evaluation or treatment in the ED or require inpatient management. At time of discharge the patient is hemodynamically stable and in no acute distress. I have discussed work-up results and diagnosis with patient and answered all questions. Patient is agreeable with discharge plan. We discussed strict return precautions for returning to the emergency department and they verbalized understanding.          Donnajean Lynwood DEL, PA-C 04/04/24 1603    Jerrol Lynwood, MD 04/04/24 [phone]

## 2024-04-04 NOTE — ED Triage Notes (Signed)
 Pt sent from [facility] with reports of chest pain x 1 month and hypertension. Pt denies radiating pain or SHOB.

## 2024-04-04 NOTE — ED Provider Notes (Signed)
 [place] EMERGENCY DEPARTMENT AT [facility] Provider Note   CSN: [id number] Arrival date & time: 04/04/24  1204     Patient presents with: Chest Pain   Ernest Myers is a 53 y.o. male.   53 year old male presents today for concern of chest pain that started while he was at work today around 9-10 AM.  Currently he is without chest pain.  He went to urgent care and was referred here.  He had similar episode about a month ago.  He was evaluated in the emergency department at that time.  He had a negative PE study then.  He was given a referral to cardiology but he did not schedule this yet.  States his sister is going to do this today for him.  Pain did not radiate anywhere.  Denies any previous heart attacks.  The history is provided by the patient. No language interpreter was used.       Prior to Admission medications   Not on File    Allergies: Patient has no known allergies.    Review of Systems  Constitutional:  Negative for chills and fever.  Respiratory:  Negative for shortness of breath.   Cardiovascular:  Positive for chest pain. Negative for palpitations and leg swelling.  Gastrointestinal:  Negative for abdominal pain.  Neurological:  Negative for light-headedness.  All other systems reviewed and are negative.   Updated Vital Signs BP (!) 150/77 (BP Location: Right Arm)   Pulse 80   Temp 98.9 F (37.2 C) (Oral)   Resp 16   Ht 5' 10 (1.778 m)   Wt (!) 181.4 kg   SpO2 98%   BMI 57.39 kg/m   Physical Exam Vitals and nursing note reviewed.  Constitutional:      General: He is not in acute distress.    Appearance: Normal appearance. He is not ill-appearing.  HENT:     Head: Normocephalic and atraumatic.     Nose: Nose normal.  Eyes:     Conjunctiva/sclera: Conjunctivae normal.  Cardiovascular:     Rate and Rhythm: Normal rate and regular rhythm.  Pulmonary:     Effort: Pulmonary effort is normal. No respiratory distress.   Musculoskeletal:        General: No deformity. Normal range of motion.     Cervical back: Normal range of motion.  Skin:    Findings: No rash.  Neurological:     Mental Status: He is alert.     (all labs ordered are listed, but only abnormal results are displayed) Labs Reviewed  BASIC METABOLIC PANEL WITH GFR - Abnormal; Notable for the following components:      Result Value   Glucose, Bld 142 (*)    All other components within normal limits  CBC - Abnormal; Notable for the following components:   Platelets 86 (*)    All other components within normal limits  TROPONIN I (HIGH SENSITIVITY)  TROPONIN I (HIGH SENSITIVITY)    EKG: None  Radiology: DG Chest 2 View Result Date: 04/04/2024 CLINICAL DATA:  Chest pain. EXAM: CHEST - 2 VIEW COMPARISON:  Chest radiograph dated 02/22/2024 FINDINGS: No focal consolidation, pleural effusion, or pneumothorax. There is mild vascular congestion. Top-normal cardiac size. No acute osseous pathology. IMPRESSION: No focal consolidation. Mild vascular congestion. Electronically Signed   By: Vanetta Chou M.D.   On: 04/04/2024 13:09     Procedures   Medications Ordered in the ED  ketorolac  (TORADOL ) 15 MG/ML injection 15 mg (has  no administration in time range)                                    Medical Decision Making Amount and/or Complexity of Data Reviewed Labs: ordered. Radiology: ordered.  Risk Prescription drug management.   Medical Decision Making / ED Course   This patient presents to the ED for concern of chest pain, this involves an extensive number of treatment options, and is a complaint that carries with it a high risk of complications and morbidity.  The differential diagnosis includes ACS, PE, pneumonia, MSK pain  MDM: 53 year old male presents today for concern of chest pain. Pain is nonradiating and without associated shortness of breath.  Chest pain is not pleuritic.  Not reproducible on exam. Low suspicion  for PE.  He is without hypoxia, tachycardia, or tachypnea. Will evaluate with ACS workup.  Given his onset of symptoms was within 6 hours we will obtain delta troponin. Currently chest pain-free.  CBC unremarkable with the exception of platelets of 86.  BMP without acute concern.  Troponin initially negative.  EKG without acute ischemic change.  Chest x-ray without acute cardiopulmonary process.  Delta troponin pending at the end of my shift.  Signed out to oncoming provider.  Cardiology referral ordered.  He had evidence of coronary atherosclerosis on his CT scan last month.    Lab Tests: -I ordered, reviewed, and interpreted labs.   The pertinent results include:   Labs Reviewed  BASIC METABOLIC PANEL WITH GFR - Abnormal; Notable for the following components:      Result Value   Glucose, Bld 142 (*)    All other components within normal limits  CBC - Abnormal; Notable for the following components:   Platelets 86 (*)    All other components within normal limits  TROPONIN I (HIGH SENSITIVITY)  TROPONIN I (HIGH SENSITIVITY)      EKG  EKG Interpretation Date/Time:    Ventricular Rate:    PR Interval:    QRS Duration:    QT Interval:    QTC Calculation:   R Axis:      Text Interpretation:           Imaging Studies ordered: I ordered imaging studies including chest x-ray I independently visualized and interpreted imaging. I agree with the radiologist interpretation   Medicines ordered and prescription drug management: Meds ordered this encounter  Medications   ketorolac  (TORADOL ) 15 MG/ML injection 15 mg    -I have reviewed the patients home medicines and have made adjustments as needed    Reevaluation: After the interventions noted above, I reevaluated the patient and found that they have :resolved  Co morbidities that complicate the patient evaluation  Past Medical History:  Diagnosis Date   Arthritis    Diabetes mellitus without complication (HCC)     Fatty liver    Hypertension       Dispostion: Signed out to oncoming provider pending delta troponin.  If this is negative he is appropriate for discharge with cardiology follow-up.    Final diagnoses:  Atypical chest pain    ED Discharge Orders          Ordered    Ambulatory referral to Cardiology       Comments: If you have not heard from the Cardiology office within the next 72 hours please call [phone].   04/04/24 1519  Hildegard Loge, PA-C 04/04/24 1519    Neysa Caron PARAS, DO 04/04/24 1529

## 2024-04-04 NOTE — ED Notes (Signed)
 Patient is being discharged from the Urgent Care and sent to the Emergency Department via personal vehicle . Per Asberry Searle, PA-C, patient is in need of higher level of care due to chest pains. Patient is aware and verbalizes understanding of plan of care.  Vitals:   04/04/24 1138  BP: (!) 174/97  Pulse: 89  Resp: 18  Temp: 98.6 F (37 C)  SpO2: 94%

## 2024-04-04 NOTE — ED Provider Notes (Signed)
 [facility]    CSN: [id number] Arrival date & time: 04/04/24  1122     History   Chief Complaint Chief Complaint  Patient presents with   Chest Pain    HPI Ernest Myers is a 53 y.o. male.  Here with 10/10 chest pain that started this morning  It lasted about 30-45 minutes. Reports spread up into the left neck and jaw. Right now he is not having pain He does feel some shortness of breath especially with moving around  Having swelling and pain in the legs. They feel warm and full today.   History of HTN, DM Does not take medication Was seen in the ED for similar last month, dx with coronary artery calcification Advised call cardiology for follow up but hasn't yet  Past Medical History:  Diagnosis Date   Arthritis    Diabetes mellitus without complication (HCC)    Fatty liver    Hypertension     Patient Active Problem List   Diagnosis Date Noted   High density lipoprotein (HDL) less than 40 mg/dL 88/79/7981   Primary osteoarthritis of both knees 05/21/2017   Needle phobia 03/26/2017   Gastroesophageal reflux disease without esophagitis 03/26/2016   Depression 03/26/2016   Bilateral chronic knee pain 01/10/2016   Right arm pain 05/01/2015   Low back pain 12/03/2014   Paresthesia 12/03/2014   Obesity, morbid, BMI 50 or higher (HCC) 11/11/2006   Essential hypertension, benign 11/11/2006   Sleep apnea 11/11/2006    History reviewed. No pertinent surgical history.     Home Medications    Prior to Admission medications   Not on File    Family History Family History  Problem Relation Age of Onset   Diabetes Mother    Heart disease Father     Social History Social History   Tobacco Use   Smoking status: Never   Smokeless tobacco: Never  Vaping Use   Vaping status: Never Used  Substance Use Topics   Alcohol use: Yes    Comment: social- 2-3 days/wk   Drug use: No     Allergies   Patient has no known allergies.   Review of  Systems Review of Systems  Cardiovascular:  Positive for chest pain.   As per HPI  Physical Exam Triage Vital Signs ED Triage Vitals  Encounter Vitals Group     BP 04/04/24 1138 (!) 174/97     Girls Systolic BP Percentile --      Girls Diastolic BP Percentile --      Boys Systolic BP Percentile --      Boys Diastolic BP Percentile --      Pulse Rate 04/04/24 1138 89     Resp 04/04/24 1138 18     Temp 04/04/24 1138 98.6 F (37 C)     Temp Source 04/04/24 1138 Oral     SpO2 04/04/24 1138 94 %     Weight --      Height --      Head Circumference --      Peak Flow --      Pain Score 04/04/24 1139 0     Pain Loc --      Pain Education --      Exclude from Growth Chart --    No data found.  Updated Vital Signs BP (!) 174/97 (BP Location: Left Arm)   Pulse 89   Temp 98.6 F (37 C) (Oral)   Resp 18   SpO2 94%  Physical Exam Vitals and nursing note reviewed.  Constitutional:      General: He is not in acute distress.    Appearance: Normal appearance. He is obese.  HENT:     Mouth/Throat:     Pharynx: Oropharynx is clear.  Cardiovascular:     Rate and Rhythm: Normal rate and regular rhythm.     Pulses: Normal pulses.     Heart sounds: Normal heart sounds.  Pulmonary:     Effort: Pulmonary effort is normal.     Breath sounds: Normal breath sounds.  Abdominal:     Palpations: Abdomen is soft.  Musculoskeletal:     Cervical back: Normal range of motion. No rigidity.     Right lower leg: 3+ Edema present.     Left lower leg: 3+ Edema present.  Skin:    General: Skin is warm and dry.  Neurological:     Mental Status: He is alert and oriented to person, place, and time.     UC Treatments / Results  Labs (all labs ordered are listed, but only abnormal results are displayed) Labs Reviewed - No data to display  EKG  Radiology No results found.  Procedures Procedures  Medications Ordered in UC Medications - No data to display  Initial Impression /  Assessment and Plan / UC Course  I have reviewed the triage vital signs and the nursing notes.  Pertinent labs & imaging results that were available during my care of the patient were reviewed by me and considered in my medical decision making (see chart for details).  BP 174/97 EKG normal sinus with ventricular rate 85 bpm. No ST/T wave changes. Compared with prior reading from 02/22/2024  With 10/10 chest pain this morning, leg swelling, dyspnea, and hypertension, have recommended patient be evaluated in the ED. Requires higher level of care to rule out emergent etiology, workup not available in the urgent care. Additionally we do not have any xray imaging available today. Patient is comfortable going to the ED, his sister will transport him via POV  Final Clinical Impressions(s) / UC Diagnoses   Final diagnoses:  Chest pain, unspecified type  Hypertension, unspecified type   Discharge Instructions   None    ED Prescriptions   None    PDMP not reviewed this encounter.   Jeryl Stabs, PA-C 04/04/24 1241

## 2024-04-04 NOTE — Discharge Instructions (Addendum)
 No concerning findings of your chest pain.  No evidence of heart attack.  Please call and follow-up with cardiology.  I have put in for a referral again today.  Return for any emergent symptoms.  Set up a primary care doctor as well.

## 2024-04-21 ENCOUNTER — Ambulatory Visit: Attending: Physician Assistant | Admitting: Physician Assistant

## 2024-04-21 ENCOUNTER — Encounter: Payer: Self-pay | Admitting: Physician Assistant

## 2024-04-21 VITALS — BP 142/82 | HR 80 | Ht 70.0 in | Wt 390.0 lb

## 2024-04-21 DIAGNOSIS — R600 Localized edema: Secondary | ICD-10-CM | POA: Diagnosis not present

## 2024-04-21 DIAGNOSIS — G4733 Obstructive sleep apnea (adult) (pediatric): Secondary | ICD-10-CM

## 2024-04-21 DIAGNOSIS — E785 Hyperlipidemia, unspecified: Secondary | ICD-10-CM | POA: Diagnosis not present

## 2024-04-21 DIAGNOSIS — R609 Edema, unspecified: Secondary | ICD-10-CM

## 2024-04-21 DIAGNOSIS — R079 Chest pain, unspecified: Secondary | ICD-10-CM

## 2024-04-21 DIAGNOSIS — D696 Thrombocytopenia, unspecified: Secondary | ICD-10-CM

## 2024-04-21 DIAGNOSIS — E119 Type 2 diabetes mellitus without complications: Secondary | ICD-10-CM

## 2024-04-21 DIAGNOSIS — I10 Essential (primary) hypertension: Secondary | ICD-10-CM

## 2024-04-21 MED ORDER — HYDROCHLOROTHIAZIDE 25 MG PO TABS: 25.0000 mg | ORAL_TABLET | Freq: Every day | ORAL | 3 refills | Status: AC

## 2024-04-21 MED ORDER — ASPIRIN 81 MG PO CAPS: 81.0000 mg | ORAL_CAPSULE | Freq: Every day | ORAL | 3 refills | Status: DC

## 2024-04-21 NOTE — Progress Notes (Signed)
 Cardiology Office Note   Date:  04/23/2024  ID:  Ernest Myers, DOB 07/26/1971, MRN [id number] PCP: Pcp, No  [facility] Providers Cardiologist:  [facility] - DOD Dr. Verlin  History of Present Illness Ernest Myers is a 53 y.o. male with past medical history of fatty liver disease, hypertension, hyperlipidemia and DM2.  Patient has been to the ED 3 times since 02/22/2024 for evaluation of chest pain and leg swelling.  Symptom started in May.  Renal function electrolyte was normal.  BNP 68.5.  CBC was 15.9.  Serial troponin negative x 2.  Chest x-ray showed enlargement of cardio pericardial silhouette with pulmonary vascular congestion.  CTA of the chest was negative for PE but does show coronary artery calcification.  Lower extremity venous Doppler was negative for DVT bilaterally he was referred to cardiology service as outpatient.  Since then, he has returned back to the ED on 04/04/2024 for chest pain radiating to the left neck and jaw.  Workup including troponin, chest x-ray were normal.  CBC showed thrombocytopenia with platelet of 86.  Renal function and electrolytes were normal.  Patient was treated with Toradol .  EKG showed no acute ischemic changes.  He says his chest discomfort has been intermittent since May.  He described as a sharp pulling left-sided chest discomfort that last for few seconds at a time but can last a total of several hours.  He thinks this symptom may be slightly worse with exertion.  On physical exam, he also has 1-2+ pitting edema which is new as well.  We recommended echocardiogram.  His recent CT image showed heavy calcification in the LAD territory.  I discussed his case with DOD Dr. Verlin who recommended proceed with cardiac catheterization for definitive evaluation.  He is a poor candidate for coronary CTA or Myoview as he is close to 400 pounds.  He does have a history of OSA, he has not used CPAP machine for many years, I will refer him to  pulmonology service to help manage his obstructive sleep apnea.  I will start him on 81 mg daily of aspirin  and 25 mg daily of HCTZ.  He is aware that we may start on a statin medication if he does have significant coronary artery disease on cath.  He will return to get a basic metabolic panel and a fasting lipid panel in 2 weeks.  I plan to see the patient back in 4 to 5 weeks.  Prior to the cardiac catheterization we will get a CBC to follow-up on the platelet level.  His last platelet was 86 moderately low.  He is not aware of any hematological disorder risk and benefit of the cardiac authorization has been explained to the patient who is agreeable to proceed.   ROS:   Patient complains of intermittent chest discomfort.  He also has lower extremity edema.  He denies any orthopnea or PND.  Studies Reviewed EKG Interpretation Date/Time:  Friday April 21 2024 09:45:43 EDT Ventricular Rate:  80 PR Interval:  146 QRS Duration:  104 QT Interval:  408 QTC Calculation: 470 R Axis:   44  Text Interpretation: Sinus rhythm with Premature atrial complexes with Abberant conduction When compared with ECG of 04-Apr-2024 12:28, Abberant conduction is now Present Confirmed by Custer Pimenta [phone]) on 04/21/2024 10:04:08 AM        Risk Assessment/Calculations          Physical Exam VS:  BP (!) 142/82 (BP Location: Left Arm, Patient  Position: Sitting, Cuff Size: Large)   Pulse 80   Ht 5' 10 (1.778 m)   Wt (!) 390 lb (176.9 kg)   BMI 55.96 kg/m        Wt Readings from Last 3 Encounters:  04/21/24 (!) 390 lb (176.9 kg)  04/04/24 (!) 400 lb (181.4 kg)  02/22/24 (!) 390 lb (176.9 kg)    GEN: Well nourished, well developed in no acute distress NECK: No JVD; No carotid bruits CARDIAC: RRR, no murmurs, rubs, gallops RESPIRATORY:  Clear to auscultation without rales, wheezing or rhonchi  ABDOMEN: Soft, non-tender, non-distended EXTREMITIES: 2+ pitting edema; No deformity   ASSESSMENT AND  PLAN  Intermittent chest discomfort: He has been seen in the ED multiple times recently due to recurrent chest discomfort.  He describes the symptom as a sharp pulling left-sided chest discomfort that only last a few seconds however symptom is slightly worse with exertion.  His cardiac risk factors include hypertension, hyperlipidemia, diabetes and age.  He also developed recent pitting edema in bilateral lower extremity.  He is close to 400 pounds, he is not a good candidate for coronary CTA or nuclear stress test.  I discussed his case with DOD Dr. Verlin who recommended proceed with cardiac catheterization for definitive evaluation.  Started on aspirin .  Leg edema: He has 2+ pitting edema in bilateral lower extremity, will obtain echocardiogram  Hypertension: Blood pressure elevated today, given lower extremity edema, will add hydrochlorothiazide  25 mg daily.  Will need basic metabolic panel in 2 weeks  Hyperlipidemia: Not on statin medication.  Will need a fasting lipid panel in a few weeks.  However if he does have coronary artery disease on cath, will start on statin medication post cath  DM2: Managed by primary care provider.  Not on medication.  History of OSA: Has not used any CPAP machine in many years.  I will refer patient to pulmonology service to help manage obstructive sleep apnea.  He likely will need a repeat sleep study  Thrombocytopenia: Recent blood work showed  platelet level has been trending down, will repeat CBC today.    Informed Consent   Shared Decision Making/Informed Consent The risks [stroke (1 in 1000), death (1 in 1000), kidney failure [usually temporary] (1 in 500), bleeding (1 in 200), allergic reaction [possibly serious] (1 in 200)], benefits (diagnostic support and management of coronary artery disease) and alternatives of a cardiac catheterization were discussed in detail with [name] and he is willing to proceed.     Dispo: Follow-up in 4 to 5  weeks  Signed, Tamika Shropshire, PA

## 2024-04-21 NOTE — H&P (View-Only) (Signed)
 Cardiology Office Note   Date:  04/23/2024  ID:  Ernest Myers, DOB 07/26/1971, MRN [id number] PCP: Pcp, No  [facility] Providers Cardiologist:  [facility] - DOD Dr. Verlin  History of Present Illness Ernest Myers is a 53 y.o. male with past medical history of fatty liver disease, hypertension, hyperlipidemia and DM2.  Patient has been to the ED 3 times since 02/22/2024 for evaluation of chest pain and leg swelling.  Symptom started in May.  Renal function electrolyte was normal.  BNP 68.5.  CBC was 15.9.  Serial troponin negative x 2.  Chest x-ray showed enlargement of cardio pericardial silhouette with pulmonary vascular congestion.  CTA of the chest was negative for PE but does show coronary artery calcification.  Lower extremity venous Doppler was negative for DVT bilaterally he was referred to cardiology service as outpatient.  Since then, he has returned back to the ED on 04/04/2024 for chest pain radiating to the left neck and jaw.  Workup including troponin, chest x-ray were normal.  CBC showed thrombocytopenia with platelet of 86.  Renal function and electrolytes were normal.  Patient was treated with Toradol .  EKG showed no acute ischemic changes.  He says his chest discomfort has been intermittent since May.  He described as a sharp pulling left-sided chest discomfort that last for few seconds at a time but can last a total of several hours.  He thinks this symptom may be slightly worse with exertion.  On physical exam, he also has 1-2+ pitting edema which is new as well.  We recommended echocardiogram.  His recent CT image showed heavy calcification in the LAD territory.  I discussed his case with DOD Dr. Verlin who recommended proceed with cardiac catheterization for definitive evaluation.  He is a poor candidate for coronary CTA or Myoview as he is close to 400 pounds.  He does have a history of OSA, he has not used CPAP machine for many years, I will refer him to  pulmonology service to help manage his obstructive sleep apnea.  I will start him on 81 mg daily of aspirin  and 25 mg daily of HCTZ.  He is aware that we may start on a statin medication if he does have significant coronary artery disease on cath.  He will return to get a basic metabolic panel and a fasting lipid panel in 2 weeks.  I plan to see the patient back in 4 to 5 weeks.  Prior to the cardiac catheterization we will get a CBC to follow-up on the platelet level.  His last platelet was 86 moderately low.  He is not aware of any hematological disorder risk and benefit of the cardiac authorization has been explained to the patient who is agreeable to proceed.   ROS:   Patient complains of intermittent chest discomfort.  He also has lower extremity edema.  He denies any orthopnea or PND.  Studies Reviewed EKG Interpretation Date/Time:  Friday April 21 2024 09:45:43 EDT Ventricular Rate:  80 PR Interval:  146 QRS Duration:  104 QT Interval:  408 QTC Calculation: 470 R Axis:   44  Text Interpretation: Sinus rhythm with Premature atrial complexes with Abberant conduction When compared with ECG of 04-Apr-2024 12:28, Abberant conduction is now Present Confirmed by Custer Pimenta [phone]) on 04/21/2024 10:04:08 AM        Risk Assessment/Calculations          Physical Exam VS:  BP (!) 142/82 (BP Location: Left Arm, Patient  Position: Sitting, Cuff Size: Large)   Pulse 80   Ht 5' 10 (1.778 m)   Wt (!) 390 lb (176.9 kg)   BMI 55.96 kg/m        Wt Readings from Last 3 Encounters:  04/21/24 (!) 390 lb (176.9 kg)  04/04/24 (!) 400 lb (181.4 kg)  02/22/24 (!) 390 lb (176.9 kg)    GEN: Well nourished, well developed in no acute distress NECK: No JVD; No carotid bruits CARDIAC: RRR, no murmurs, rubs, gallops RESPIRATORY:  Clear to auscultation without rales, wheezing or rhonchi  ABDOMEN: Soft, non-tender, non-distended EXTREMITIES: 2+ pitting edema; No deformity   ASSESSMENT AND  PLAN  Intermittent chest discomfort: He has been seen in the ED multiple times recently due to recurrent chest discomfort.  He describes the symptom as a sharp pulling left-sided chest discomfort that only last a few seconds however symptom is slightly worse with exertion.  His cardiac risk factors include hypertension, hyperlipidemia, diabetes and age.  He also developed recent pitting edema in bilateral lower extremity.  He is close to 400 pounds, he is not a good candidate for coronary CTA or nuclear stress test.  I discussed his case with DOD Dr. Verlin who recommended proceed with cardiac catheterization for definitive evaluation.  Started on aspirin .  Leg edema: He has 2+ pitting edema in bilateral lower extremity, will obtain echocardiogram  Hypertension: Blood pressure elevated today, given lower extremity edema, will add hydrochlorothiazide  25 mg daily.  Will need basic metabolic panel in 2 weeks  Hyperlipidemia: Not on statin medication.  Will need a fasting lipid panel in a few weeks.  However if he does have coronary artery disease on cath, will start on statin medication post cath  DM2: Managed by primary care provider.  Not on medication.  History of OSA: Has not used any CPAP machine in many years.  I will refer patient to pulmonology service to help manage obstructive sleep apnea.  He likely will need a repeat sleep study  Thrombocytopenia: Recent blood work showed  platelet level has been trending down, will repeat CBC today.    Informed Consent   Shared Decision Making/Informed Consent The risks [stroke (1 in 1000), death (1 in 1000), kidney failure [usually temporary] (1 in 500), bleeding (1 in 200), allergic reaction [possibly serious] (1 in 200)], benefits (diagnostic support and management of coronary artery disease) and alternatives of a cardiac catheterization were discussed in detail with [name] and he is willing to proceed.     Dispo: Follow-up in 4 to 5  weeks  Signed, Tamika Shropshire, PA

## 2024-04-21 NOTE — Patient Instructions (Signed)
 Medication Instructions:  START TAKING ASPIRIN  81 MG DAILY. START TAKING HYDROCHLOROTHIAZIDE  25 MG DAILY. REFERRAL HAS BEEN OUT IN TO PULMONARY.    Lab Work: CBC TO BE DONE TODAY. FASTING LIPID PANEL AND BMET TO BE DONE IN 2 WEEKS.   Testing/Procedures:  [facility] A [facility]PT OF [place].  [facility] [address] [facility] DEPT OF THE . [facility] [address]  [place] [address] Dept: [phone] Loc: [phone]  Ernest Myers  04/21/2024  You are scheduled for a Cardiac Catheterization on Tuesday, August 19 with Dr. [name].  1. Please arrive at the [facility] (Main Entrance A) at [facility]: [address] [place], [place] [address] at 11:30 AM (This time is 2 hour(s) before your procedure to ensure your preparation).   Free valet parking service is available. You will check in at ADMITTING. The support person will be asked to wait in the waiting room.  It is OK to have someone drop you off and come back when you are ready to be discharged.    Special note: Every effort is made to have your procedure done on time. Please understand that emergencies sometimes delay scheduled procedures.  2. Diet: No solid foods after midnight. You may have clear liquids until you arrive at the hospital.  List of approved liquids water, clear juice, clear tea, black coffee, fruit juices, non-citric and without pulp, carbonated beverages, Gatorade, Kool -Aid, plain Jello-O and plain ice popsicles.   3. Hydration: You need to be well hydrated before your procedure time. You may drink approved liquids (see below) until you arrive at the hospital. On the way to the hospital, please drink a 16-oz (1 plastic bottle) of water.   List of approved liquids water, clear juice, clear tea, black coffee, fruit juices, non-citric and without pulp, carbonated beverages, Gatorade, Kool -Aid, plain Jello-O and plain ice popsicles.   4. Labs: You will need to  have blood drawn on Friday, August 8 at [facility] (1st Floor), [address], [place], [place] [address]. You do not need to be fasting.  5. Medication instructions in preparation for your procedure:   Contrast Allergy: No  No current outpatient medications on file.  On the morning of your procedure, take your Aspirin  81 mg and any morning medicines NOT listed above.  You may use sips of water.  6. Plan to go home the same day, you will only stay overnight if medically necessary. 7. Bring a current list of your medications and current insurance cards. 8. You MUST have a responsible person to drive you home. 9. Someone MUST be with you the first 24 hours after you arrive home or your discharge will be delayed. 10. Please wear clothes that are easy to get on and off and wear slip-on shoes.  Thank you for allowing us  to care for you!   -- [facility]    Your physician has requested that you have an ECHOCARDIOGRAM. Echocardiography is a painless test that uses sound waves to create images of your heart. It provides your doctor with information about the size and shape of your heart and how well your heart's chambers and valves are working. This procedure takes approximately one hour. There are no restrictions for this procedure. Please do NOT wear cologne, perfume, aftershave, or lotions (deodorant is allowed). Please arrive 15 minutes prior to your appointment time.  Please note: We ask at that you not bring children with you during ultrasound (echo/ vascular) testing. Due to room size and safety concerns, children are not allowed in the ultrasound rooms during exams. Our front office staff cannot provide observation of children in our lobby area while testing is being conducted. An adult accompanying a patient to their appointment will only be allowed in the ultrasound room at the discretion of the ultrasound  technician under special circumstances. We apologize for any inconvenience.    Follow-Up: At [facility], you and your health needs are our priority.  As part of our continuing mission to provide you with exceptional heart care, our providers are all part of one team.  This team includes your primary Cardiologist (physician) and Advanced Practice Providers or APPs (Physician Assistants and Nurse Practitioners) who all work together to provide you with the care you need, when you need it.  Your next appointment:   4-5 WEEKS  Provider:   HAO MENG, PA

## 2024-04-22 LAB — CBC
Hematocrit: 45.9 % (ref 37.5–51.0)
Hemoglobin: 15.4 g/dL (ref 13.0–17.7)
MCH: 33.2 pg — ABNORMAL HIGH (ref 26.6–33.0)
MCHC: 33.6 g/dL (ref 31.5–35.7)
MCV: 99 fL — ABNORMAL HIGH (ref 79–97)
Platelets: 100 x10E3/uL — CL (ref 150–450)
RBC: 4.64 x10E6/uL (ref 4.14–5.80)
RDW: 14.3 % (ref 11.6–15.4)
WBC: 6.5 x10E3/uL (ref 3.4–10.8)

## 2024-04-23 ENCOUNTER — Other Ambulatory Visit: Payer: Self-pay | Admitting: Physician Assistant

## 2024-04-24 ENCOUNTER — Ambulatory Visit: Payer: Self-pay | Admitting: Physician Assistant

## 2024-04-24 NOTE — Progress Notes (Signed)
 Platelet level still lower than normal, but improving when compare to 2 weeks ago.

## 2024-04-25 ENCOUNTER — Encounter (HOSPITAL_COMMUNITY): Payer: Self-pay

## 2024-04-25 ENCOUNTER — Emergency Department (HOSPITAL_COMMUNITY)

## 2024-04-25 ENCOUNTER — Other Ambulatory Visit: Payer: Self-pay

## 2024-04-25 ENCOUNTER — Emergency Department (HOSPITAL_COMMUNITY)
Admission: EM | Admit: 2024-04-25 | Discharge: 2024-04-25 | Disposition: A | Discharge status: Home / Self Care | Attending: Emergency Medicine | Admitting: Emergency Medicine

## 2024-04-25 DIAGNOSIS — D696 Thrombocytopenia, unspecified: Secondary | ICD-10-CM | POA: Diagnosis not present

## 2024-04-25 DIAGNOSIS — N390 Urinary tract infection, site not specified: Secondary | ICD-10-CM | POA: Diagnosis not present

## 2024-04-25 DIAGNOSIS — Z7982 Long term (current) use of aspirin: Secondary | ICD-10-CM | POA: Diagnosis not present

## 2024-04-25 DIAGNOSIS — R109 Unspecified abdominal pain: Secondary | ICD-10-CM | POA: Diagnosis present

## 2024-04-25 LAB — URINALYSIS, W/ REFLEX TO CULTURE (INFECTION SUSPECTED)
Bilirubin Urine: NEGATIVE
Glucose, UA: NEGATIVE mg/dL
Ketones, ur: NEGATIVE mg/dL
Nitrite: NEGATIVE
Protein, ur: 30 mg/dL — AB
Specific Gravity, Urine: 1.019 (ref 1.005–1.030)
pH: 6 (ref 5.0–8.0)

## 2024-04-25 LAB — COMPREHENSIVE METABOLIC PANEL WITH GFR
ALT: 46 U/L — ABNORMAL HIGH (ref 0–44)
AST: 47 U/L — ABNORMAL HIGH (ref 15–41)
Albumin: 3.3 g/dL — ABNORMAL LOW (ref 3.5–5.0)
Alkaline Phosphatase: 43 U/L (ref 38–126)
Anion gap: 11 (ref 5–15)
BUN: 10 mg/dL (ref 6–20)
CO2: 24 mmol/L (ref 22–32)
Calcium: 8.7 mg/dL — ABNORMAL LOW (ref 8.9–10.3)
Chloride: 95 mmol/L — ABNORMAL LOW (ref 98–111)
Creatinine, Ser: 0.92 mg/dL (ref 0.61–1.24)
GFR, Estimated: >60 mL/min (ref >60)
Glucose, Bld: 226 mg/dL — ABNORMAL HIGH (ref 70–99)
Potassium: 3.6 mmol/L (ref 3.5–5.1)
Sodium: 130 mmol/L — ABNORMAL LOW (ref 135–145)
Total Bilirubin: 2.2 mg/dL — ABNORMAL HIGH (ref 0.0–1.2)
Total Protein: 7.8 g/dL (ref 6.5–8.1)

## 2024-04-25 LAB — CBC WITH DIFFERENTIAL/PLATELET
Abs Granulocyte: 10 K/uL — ABNORMAL HIGH (ref 1.5–6.5)
Abs Immature Granulocytes: 0.11 K/uL — ABNORMAL HIGH (ref 0.00–0.07)
Basophils Absolute: 0.1 K/uL (ref 0.0–0.1)
Basophils Relative: 1 %
Eosinophils Absolute: 0 K/uL (ref 0.0–0.5)
Eosinophils Relative: 0 %
HCT: 44.4 % (ref 39.0–52.0)
Hemoglobin: 15.3 g/dL (ref 13.0–17.0)
Immature Granulocytes: 1 %
Lymphocytes Relative: 10 %
Lymphs Abs: 1.2 K/uL (ref 0.7–4.0)
MCH: 32.6 pg (ref 26.0–34.0)
MCHC: 34.5 g/dL (ref 30.0–36.0)
MCV: 94.7 fL (ref 80.0–100.0)
Monocytes Absolute: 1.3 K/uL — ABNORMAL HIGH (ref 0.1–1.0)
Monocytes Relative: 10 %
Neutro Abs: 10 K/uL — ABNORMAL HIGH (ref 1.7–7.7)
Neutrophils Relative %: 78 %
Platelets: 82 K/uL — ABNORMAL LOW (ref 150–400)
RBC: 4.69 MIL/uL (ref 4.22–5.81)
RDW: 13.9 % (ref 11.5–15.5)
WBC: 12.7 K/uL — ABNORMAL HIGH (ref 4.0–10.5)
nRBC: 0 % (ref 0.0–0.2)

## 2024-04-25 LAB — RESP PANEL BY RT-PCR (RSV, FLU A&B, COVID)  RVPGX2
Influenza A by PCR: NEGATIVE
Influenza B by PCR: NEGATIVE
Resp Syncytial Virus by PCR: NEGATIVE
SARS Coronavirus 2 by RT PCR: NEGATIVE

## 2024-04-25 LAB — I-STAT CG4 LACTIC ACID, ED
Lactic Acid, Venous: 1.2 mmol/L (ref 0.5–1.9)
Lactic Acid, Venous: 0.9 mmol/L (ref 0.5–1.9)

## 2024-04-25 MED ORDER — IBUPROFEN 400 MG PO TABS
600.0000 mg | ORAL_TABLET | Freq: Once | ORAL | Status: AC
  Administered 2024-04-25 (×2): 600 mg via ORAL
  Filled 2024-04-25: qty 1

## 2024-04-25 MED ORDER — CEPHALEXIN 500 MG PO CAPS: 500.0000 mg | ORAL_CAPSULE | Freq: Four times a day (QID) | ORAL | 0 refills | Status: DC

## 2024-04-25 MED ORDER — DOXYCYCLINE HYCLATE 100 MG PO CAPS: 100.0000 mg | ORAL_CAPSULE | Freq: Two times a day (BID) | ORAL | 0 refills | Status: DC

## 2024-04-25 MED ORDER — OXYCODONE-ACETAMINOPHEN 5-325 MG PO TABS
2.0000 | ORAL_TABLET | Freq: Once | ORAL | Status: AC
  Administered 2024-04-25 (×2): 2 via ORAL
  Filled 2024-04-25: qty 2

## 2024-04-25 NOTE — Discharge Instructions (Addendum)
 You have a lung area of tissue that needs to be reevaluated by a CAT scan in the next 1 to 2 months to make sure that this is not cancerous.  Follow-up with your doctor for this. Chest x-ray showed a possible pneumonia.  Return if you get short of breath.  Take Tylenol  and ibuprofen  as directed for your fever.

## 2024-04-25 NOTE — ED Triage Notes (Signed)
 Pt came in via POV d/t Lt side bothering him for 3 days, denies recent falls does have a cough the last 3 days as well. A/Ox4, rates his pain 9/10 during triage. Denies abd pain or back pain.

## 2024-04-25 NOTE — ED Notes (Signed)
 Went to medicate patient, bed and patient was gone. No one notified care team he was taken to scan.

## 2024-04-25 NOTE — ED Provider Notes (Signed)
 [place] EMERGENCY DEPARTMENT AT [facility] Provider Note   CSN: [id number] Arrival date & time: 04/25/24  9167     Patient presents with: Lt side pain   Ernest Myers is a 53 y.o. male.   Patient presents complaining of 3 days of left-sided flank pain.  History of kidney stones and states this feels similar.  Denies any dysuria or hematuria.  Pain is worse when he makes certain movements.  Has had some URI symptoms recently but denies any severe cough.  Does note some bodyaches.  Pain does radiate from his left flank to his groin.  No history of trauma.  No treatment use prior to arrival       Prior to Admission medications   Medication Sig Start Date End Date Taking? Authorizing Provider  Aspirin  81 MG CAPS Take 81 mg by mouth daily. 04/21/24   Meng, Hao, PA  hydrochlorothiazide  (HYDRODIURIL ) 25 MG tablet Take 1 tablet (25 mg total) by mouth daily. 04/21/24 07/20/24  Meng, Hao, PA    Allergies: Patient has no known allergies.    Review of Systems  All other systems reviewed and are negative.   Updated Vital Signs BP (!) 158/94   Pulse 98   Temp (!) 100.5 F (38.1 C)   Resp (!) 24   Ht 1.778 m (5' 10)   Wt (!) 178.3 kg   SpO2 100%   BMI 56.39 kg/m   Physical Exam Vitals and nursing note reviewed.  Constitutional:      General: He is not in acute distress.    Appearance: Normal appearance. He is well-developed. He is not toxic-appearing.  HENT:     Head: Normocephalic and atraumatic.  Eyes:     General: Lids are normal.     Conjunctiva/sclera: Conjunctivae normal.     Pupils: Pupils are equal, round, and reactive to light.  Neck:     Thyroid: No thyroid mass.     Trachea: No tracheal deviation.  Cardiovascular:     Rate and Rhythm: Normal rate and regular rhythm.     Heart sounds: Normal heart sounds. No murmur heard.    No gallop.  Pulmonary:     Effort: Pulmonary effort is normal. No respiratory distress.     Breath sounds: Normal breath  sounds. No stridor. No decreased breath sounds, wheezing, rhonchi or rales.  Abdominal:     General: There is no distension.     Palpations: Abdomen is soft.     Tenderness: There is no abdominal tenderness. There is no rebound.   Musculoskeletal:        General: No tenderness. Normal range of motion.     Cervical back: Normal range of motion and neck supple.  Skin:    General: Skin is warm and dry.     Findings: No abrasion or rash.  Neurological:     Mental Status: He is alert and oriented to person, place, and time. Mental status is at baseline.     GCS: GCS eye subscore is 4. GCS verbal subscore is 5. GCS motor subscore is 6.     Cranial Nerves: No cranial nerve deficit.     Sensory: No sensory deficit.     Motor: Motor function is intact.  Psychiatric:        Attention and Perception: Attention normal.        Speech: Speech normal.        Behavior: Behavior normal.     (all labs ordered  are listed, but only abnormal results are displayed) Labs Reviewed  COMPREHENSIVE METABOLIC PANEL WITH GFR - Abnormal; Notable for the following components:      Result Value   Sodium 130 (*)    Chloride 95 (*)    Glucose, Bld 226 (*)    Calcium 8.7 (*)    Albumin 3.3 (*)    AST 47 (*)    ALT 46 (*)    Total Bilirubin 2.2 (*)    All other components within normal limits  CBC WITH DIFFERENTIAL/PLATELET - Abnormal; Notable for the following components:   WBC 12.7 (*)    Platelets 82 (*)    Neutro Abs 10.0 (*)    Monocytes Absolute 1.3 (*)    Abs Immature Granulocytes 0.11 (*)    Abs Granulocyte 10.0 (*)    All other components within normal limits  URINALYSIS, W/ REFLEX TO CULTURE (INFECTION SUSPECTED)  I-STAT CG4 LACTIC ACID, ED    EKG: EKG Interpretation Date/Time:  Tuesday April 25 2024 09:28:19 EDT Ventricular Rate:  98 PR Interval:  135 QRS Duration:  103 QT Interval:  370 QTC Calculation: 473 R Axis:   79  Text Interpretation: Sinus rhythm Borderline repolarization  abnormality No significant change since last tracing Confirmed by Dasie Faden ([id number]) on 04/25/2024 9:33:16 AM  Radiology: No results found.   Procedures   Medications Ordered in the ED  oxyCODONE -acetaminophen  (PERCOCET/ROXICET) 5-325 MG per tablet 2 tablet (has no administration in time range)                                    Medical Decision Making Amount and/or Complexity of Data Reviewed Labs: ordered. Radiology: ordered.  Risk Prescription drug management.   Patient's labs significant for mild UTI with a chest x-ray that shows possible pneumonia.  Patient was febrile here to 101.2.  Does not appear septic at this time.  Lactate was normal.  Fever treated with Tylenol  and ibuprofen .  Respiratory viral panel was negative.  Mild thrombocytopenia noted which is chronic for patient.  Blood pressure has been stable.  Patient's renal CT did identify a 17 by 11 millimeter right lower lobe density.  Patient will be given follow-up instructions for this.  Will place patient on antibiotics and discharged home    Final diagnoses:  None    ED Discharge Orders     None          Dasie Faden, MD 04/25/24 1310

## 2024-05-01 ENCOUNTER — Telehealth: Payer: Self-pay | Admitting: *Deleted

## 2024-05-01 NOTE — Telephone Encounter (Signed)
 Reviewed procedure instructions with patient, see notes

## 2024-05-01 NOTE — Telephone Encounter (Signed)
 Patient returned RN's call.

## 2024-05-01 NOTE — Telephone Encounter (Addendum)
 Cardiac Catheterization scheduled at [facility] for: Tuesday May 02, 2024 1:30 PM Arrival time [facility] Main Entrance A at: 11:30 AM  Diet: -May have light meal until 7:30 AM. (6 hours before procedure time) Approved light meal consists of plain toast, fruit, light soups, crackers.  Hydration: -May drink clear liquids until leaving for hospital. Approved liquids: Water , clear tea, black coffee, fruit juices-non-citric and without pulp,Gatorade, plain Jello/popsicles.  Drink 16 oz. bottle of water  on the way to the hospital.  Medication instructions: -Hold:  Hydrochlorothiazide -AM of procedure -Other usual morning medications can be taken including aspirin  81 mg.  Plan to go home the same day, you will only stay overnight if medically necessary.  You must have responsible adult to drive you home.  Someone must be with you the first 24 hours after you arrive home.  Reviewed procedure instructions with patient.  Patient tells me that he has been feeling okay, denies any fever.

## 2024-05-02 ENCOUNTER — Ambulatory Visit (HOSPITAL_COMMUNITY)
Admission: RE | Admit: 2024-05-02 | Discharge: 2024-05-02 | Disposition: A | Discharge status: Home / Self Care | Attending: Cardiovascular Disease | Admitting: Cardiovascular Disease

## 2024-05-02 ENCOUNTER — Encounter (HOSPITAL_COMMUNITY)
Admission: RE | Disposition: A | Discharge status: Home / Self Care | Payer: Self-pay | Source: Home / Self Care | Attending: Cardiovascular Disease

## 2024-05-02 ENCOUNTER — Other Ambulatory Visit: Payer: Self-pay

## 2024-05-02 DIAGNOSIS — I10 Essential (primary) hypertension: Secondary | ICD-10-CM | POA: Insufficient documentation

## 2024-05-02 DIAGNOSIS — Z7982 Long term (current) use of aspirin: Secondary | ICD-10-CM | POA: Diagnosis not present

## 2024-05-02 DIAGNOSIS — G4733 Obstructive sleep apnea (adult) (pediatric): Secondary | ICD-10-CM | POA: Insufficient documentation

## 2024-05-02 DIAGNOSIS — E785 Hyperlipidemia, unspecified: Secondary | ICD-10-CM | POA: Insufficient documentation

## 2024-05-02 DIAGNOSIS — D696 Thrombocytopenia, unspecified: Secondary | ICD-10-CM | POA: Diagnosis not present

## 2024-05-02 DIAGNOSIS — I2510 Atherosclerotic heart disease of native coronary artery without angina pectoris: Secondary | ICD-10-CM | POA: Diagnosis not present

## 2024-05-02 DIAGNOSIS — K760 Fatty (change of) liver, not elsewhere classified: Secondary | ICD-10-CM | POA: Insufficient documentation

## 2024-05-02 DIAGNOSIS — R600 Localized edema: Secondary | ICD-10-CM | POA: Insufficient documentation

## 2024-05-02 DIAGNOSIS — E119 Type 2 diabetes mellitus without complications: Secondary | ICD-10-CM | POA: Insufficient documentation

## 2024-05-02 DIAGNOSIS — R072 Precordial pain: Secondary | ICD-10-CM | POA: Insufficient documentation

## 2024-05-02 DIAGNOSIS — Z79899 Other long term (current) drug therapy: Secondary | ICD-10-CM | POA: Insufficient documentation

## 2024-05-02 HISTORY — PX: LEFT HEART CATH AND CORONARY ANGIOGRAPHY: CATH118249

## 2024-05-02 SURGERY — LEFT HEART CATH AND CORONARY ANGIOGRAPHY
Anesthesia: LOCAL

## 2024-05-02 MED ORDER — FENTANYL CITRATE (PF) 100 MCG/2ML IJ SOLN
INTRAMUSCULAR | Status: DC | PRN
  Administered 2024-05-02: 25 ug via INTRAVENOUS

## 2024-05-02 MED ORDER — ACETAMINOPHEN 325 MG PO TABS: 650.0000 mg | ORAL_TABLET | ORAL | Status: DC | PRN

## 2024-05-02 MED ORDER — ONDANSETRON HCL 4 MG/2ML IJ SOLN: 4.0000 mg | Freq: Four times a day (QID) | INTRAMUSCULAR | Status: DC | PRN

## 2024-05-02 MED ORDER — FREE WATER: 500.0000 mL | Freq: Once | Status: DC

## 2024-05-02 MED ORDER — IOHEXOL 350 MG/ML SOLN
INTRAVENOUS | Status: DC | PRN
  Administered 2024-05-02: 70 mL

## 2024-05-02 MED ORDER — VERAPAMIL HCL 2.5 MG/ML IV SOLN
INTRAVENOUS | Status: AC
Start: 2024-05-02 — End: 2024-05-02
  Filled 2024-05-02: qty 2

## 2024-05-02 MED ORDER — SODIUM CHLORIDE 0.9% FLUSH
3.0000 mL | INTRAVENOUS | Status: DC | PRN
Start: 2024-05-02 — End: 2024-05-02

## 2024-05-02 MED ORDER — HEPARIN SODIUM (PORCINE) 1000 UNIT/ML IJ SOLN
INTRAMUSCULAR | Status: DC | PRN
  Administered 2024-05-02: 7000 [IU] via INTRAVENOUS

## 2024-05-02 MED ORDER — LABETALOL HCL 5 MG/ML IV SOLN
10.0000 mg | INTRAVENOUS | Status: DC | PRN
Start: 2024-05-02 — End: 2024-05-02

## 2024-05-02 MED ORDER — SODIUM CHLORIDE 0.9 % IV SOLN: 250.0000 mL | INTRAVENOUS | Status: DC | PRN

## 2024-05-02 MED ORDER — HEPARIN SODIUM (PORCINE) 1000 UNIT/ML IJ SOLN
INTRAMUSCULAR | Status: AC
  Filled 2024-05-02: qty 10

## 2024-05-02 MED ORDER — VERAPAMIL HCL 2.5 MG/ML IV SOLN
INTRAVENOUS | Status: DC | PRN
  Administered 2024-05-02: 10 mL via INTRA_ARTERIAL

## 2024-05-02 MED ORDER — HYDRALAZINE HCL 20 MG/ML IJ SOLN
10.0000 mg | INTRAMUSCULAR | Status: DC | PRN
Start: 2024-05-02 — End: 2024-05-02

## 2024-05-02 MED ORDER — LIDOCAINE HCL (PF) 1 % IJ SOLN
INTRAMUSCULAR | Status: DC | PRN
  Administered 2024-05-02: 2 mL via INTRADERMAL

## 2024-05-02 MED ORDER — ASPIRIN 81 MG PO CHEW: 81.0000 mg | CHEWABLE_TABLET | ORAL | Status: DC

## 2024-05-02 MED ORDER — SODIUM CHLORIDE 0.9% FLUSH: 3.0000 mL | INTRAVENOUS | Status: DC | PRN

## 2024-05-02 MED ORDER — MIDAZOLAM HCL 2 MG/2ML IJ SOLN
INTRAMUSCULAR | Status: DC | PRN
  Administered 2024-05-02: 2 mg via INTRAVENOUS

## 2024-05-02 MED ORDER — FENTANYL CITRATE (PF) 100 MCG/2ML IJ SOLN
INTRAMUSCULAR | Status: AC
  Filled 2024-05-02: qty 2

## 2024-05-02 MED ORDER — LIDOCAINE HCL (PF) 1 % IJ SOLN
INTRAMUSCULAR | Status: AC
  Filled 2024-05-02: qty 30

## 2024-05-02 MED ORDER — HEPARIN (PORCINE) IN NACL 1000-0.9 UT/500ML-% IV SOLN
INTRAVENOUS | Status: DC | PRN
  Administered 2024-05-02: 1000 mL

## 2024-05-02 MED ORDER — SODIUM CHLORIDE 0.9% FLUSH: 3.0000 mL | Freq: Two times a day (BID) | INTRAVENOUS | Status: DC

## 2024-05-02 MED ORDER — SODIUM CHLORIDE 0.9 % IV SOLN
250.0000 mL | INTRAVENOUS | Status: DC | PRN
Start: 2024-05-02 — End: 2024-05-02

## 2024-05-02 MED ORDER — MIDAZOLAM HCL 2 MG/2ML IJ SOLN
INTRAMUSCULAR | Status: AC
  Filled 2024-05-02: qty 2

## 2024-05-02 SURGICAL SUPPLY — 8 items
CATH 5FR JL3.5 JR4 ANG PIG MP (CATHETERS) IMPLANT
DEVICE RAD COMP TR BAND LRG (VASCULAR PRODUCTS) IMPLANT
GLIDESHEATH SLEND SS 6F .021 (SHEATH) IMPLANT
GUIDEWIRE INQWIRE 1.5J.035X260 (WIRE) IMPLANT
KIT SINGLE USE MANIFOLD (KITS) IMPLANT
PACK CARDIAC CATHETERIZATION (CUSTOM PROCEDURE TRAY) ×2 IMPLANT
SET ATX-X65L (MISCELLANEOUS) IMPLANT
SHEATH PROBE COVER 6X72 (BAG) IMPLANT

## 2024-05-02 NOTE — Progress Notes (Signed)
 Discharge instructions reviewed with patient and sister Lorenza over the phone denies questions or concerns at this time. TR band removed gauze dressing put in place, no S/S of complications. PT ambulated to the bathroom where he was able to void without difficulty. PT escorted front he unit via wheelchair to personal vehicle.

## 2024-05-02 NOTE — Interval H&P Note (Signed)
 History and Physical Interval Note:  05/02/2024 11:33 AM  Ernest Myers  has presented today for surgery, with the diagnosis of chest pain.  The various methods of treatment have been discussed with the patient and family. After consideration of risks, benefits and other options for treatment, the patient has consented to  Procedure(s): LEFT HEART CATH AND CORONARY ANGIOGRAPHY (N/A) as a surgical intervention.  The patient's history has been reviewed, patient examined, no change in status, stable for surgery.  I have reviewed the patient's chart and labs.  Questions were answered to the patient's satisfaction.     [name]

## 2024-05-02 NOTE — Discharge Instructions (Signed)

## 2024-05-03 ENCOUNTER — Encounter (HOSPITAL_COMMUNITY): Payer: Self-pay | Admitting: Cardiovascular Disease

## 2024-05-04 ENCOUNTER — Other Ambulatory Visit

## 2024-05-04 ENCOUNTER — Ambulatory Visit (INDEPENDENT_AMBULATORY_CARE_PROVIDER_SITE_OTHER): Admitting: Family Medicine

## 2024-05-04 ENCOUNTER — Encounter: Payer: Self-pay | Admitting: Family Medicine

## 2024-05-04 VITALS — BP 132/86 | HR 82 | Temp 97.7°F | Ht 70.0 in | Wt 389.0 lb

## 2024-05-04 DIAGNOSIS — K7460 Unspecified cirrhosis of liver: Secondary | ICD-10-CM

## 2024-05-04 DIAGNOSIS — R918 Other nonspecific abnormal finding of lung field: Secondary | ICD-10-CM

## 2024-05-04 DIAGNOSIS — I10 Essential (primary) hypertension: Secondary | ICD-10-CM | POA: Diagnosis not present

## 2024-05-04 DIAGNOSIS — K5790 Diverticulosis of intestine, part unspecified, without perforation or abscess without bleeding: Secondary | ICD-10-CM

## 2024-05-04 DIAGNOSIS — Z6841 Body Mass Index (BMI) 40.0 and over, adult: Secondary | ICD-10-CM

## 2024-05-04 DIAGNOSIS — R7303 Prediabetes: Secondary | ICD-10-CM

## 2024-05-04 DIAGNOSIS — R7989 Other specified abnormal findings of blood chemistry: Secondary | ICD-10-CM

## 2024-05-04 DIAGNOSIS — D696 Thrombocytopenia, unspecified: Secondary | ICD-10-CM

## 2024-05-04 DIAGNOSIS — R600 Localized edema: Secondary | ICD-10-CM

## 2024-05-04 DIAGNOSIS — G4733 Obstructive sleep apnea (adult) (pediatric): Secondary | ICD-10-CM

## 2024-05-04 DIAGNOSIS — K8020 Calculus of gallbladder without cholecystitis without obstruction: Secondary | ICD-10-CM

## 2024-05-04 DIAGNOSIS — K760 Fatty (change of) liver, not elsewhere classified: Secondary | ICD-10-CM

## 2024-05-04 LAB — POCT GLYCOSYLATED HEMOGLOBIN (HGB A1C): Hemoglobin A1C: 6.2 % — AB (ref 4.0–5.6)

## 2024-05-04 NOTE — Patient Instructions (Addendum)
 Please go to [facility] at [place] for labs. Go in the first level and press B on the elevator to go to the lab in the basement.   Address is [address] [place]   I am referring you to [facility] for liver disease and they will call you to schedule.   Cut back and stop drinking alcohol.   I will be in touch with your lab results from today and with recommendations.

## 2024-05-04 NOTE — Progress Notes (Signed)
 New Patient Office Visit  Subjective    Patient ID: Ernest Myers, male    DOB: Jun 15, 1971  Age: 53 y.o. MRN: [id number]  CC:  Chief Complaint  Patient presents with   Establish Care    No concerns.     Discussed the use of AI scribe software for clinical note transcription with the patient, who gave verbal consent to proceed.  History of Present Illness Ernest Myers is a 53 year old male who presents to establish care.  Chest pain and cardiac evaluation - Underwent left heart catheterization two days ago for chest pain, which revealed no significant findings - Currently experiences soreness but no chest pain  Respiratory symptoms and pneumonia - Currently on antibiotics for pneumonia - No shortness of breath - Improvement in symptoms compared to emergency room visit  Hypertension and peripheral edema - Hypertension managed with HCTZ - Takes 81 mg aspirin  daily - Leg swelling present  Hyperlipidemia - Started on cholesterol medication approximately one week ago - Good medication compliance  Sleep apnea - Diagnosed with sleep apnea - Does not use CPAP machine due to difficulty  Glycemic control - Prediabetes with recent blood glucose of 226 mg/dL - Denies diabetes diagnosis - Not on medication for blood sugar control  Electrolyte and hepatic abnormalities - History of hyponatremia (low sodium) - History of elevated liver enzymes  Alcohol use and cirrhosis - Consumes one to two alcoholic drinks daily - Diagnosed with cirrhosis per recent CT  - Reports hx of fatty liver but denies being aware of cirrhosis       Outpatient Encounter Medications as of 05/04/2024  Medication Sig   Aspirin  81 MG CAPS Take 81 mg by mouth daily.   cephALEXin  (KEFLEX ) 500 MG capsule Take 1 capsule (500 mg total) by mouth 4 (four) times daily.   doxycycline  (VIBRAMYCIN ) 100 MG capsule Take 1 capsule (100 mg total) by mouth 2 (two) times daily.   hydrochlorothiazide   (HYDRODIURIL ) 25 MG tablet Take 1 tablet (25 mg total) by mouth daily.   No facility-administered encounter medications on file as of 05/04/2024.    Past Medical History:  Diagnosis Date   Arthritis    Diabetes mellitus without complication (HCC)    Fatty liver    Hypertension     Past Surgical History:  Procedure Laterality Date   LEFT HEART CATH AND CORONARY ANGIOGRAPHY N/A 05/02/2024   Procedure: LEFT HEART CATH AND CORONARY ANGIOGRAPHY;  Surgeon: Wonda Sharper, MD;  Location: [facility] INVASIVE CV LAB;  Service: Cardiovascular;  Laterality: N/A;    Family History  Problem Relation Age of Onset   Diabetes Mother    Heart disease Father     Social History   Socioeconomic History   Marital status: Single    Spouse name: Not on file   Number of children: Not on file   Years of education: Not on file   Highest education level: 12th grade  Occupational History   Not on file  Tobacco Use   Smoking status: Never   Smokeless tobacco: Never  Vaping Use   Vaping status: Never Used  Substance and Sexual Activity   Alcohol use: Yes    Comment: social- 2-3 days/wk   Drug use: No   Sexual activity: Not on file  Other Topics Concern   Not on file  Social History Narrative   Not on file   Social Drivers of Health   Financial Resource Strain: Medium Risk (05/03/2024)   Overall Financial  Resource Strain (CARDIA)    Difficulty of Paying Living Expenses: Somewhat hard  Food Insecurity: Food Insecurity Present (05/03/2024)   Hunger Vital Sign    Worried About Running Out of Food in the Last Year: Sometimes true    Ran Out of Food in the Last Year: Sometimes true  Transportation Needs: No Transportation Needs (05/03/2024)   PRAPARE - Administrator, Civil Service (Medical): No    Lack of Transportation (Non-Medical): No  Physical Activity: Insufficiently Active (05/03/2024)   Exercise Vital Sign    Days of Exercise per Week: 4 days    Minutes of Exercise per Session: 20  min  Stress: No Stress Concern Present (05/03/2024)   Ernest Myers of Occupational Health - Occupational Stress Questionnaire    Feeling of Stress: Only a little  Social Connections: Moderately Isolated (05/03/2024)   Social Connection and Isolation Panel    Frequency of Communication with Friends and Family: More than three times a week    Frequency of Social Gatherings with Friends and Family: More than three times a week    Attends Religious Services: 1 to 4 times per year    Active Member of [employer] or Organizations: No    Attends Engineer, structural: Not on file    Marital Status: Divorced  Intimate Partner Violence: Not on file    Review of Systems  Constitutional:  Negative for chills, fever, malaise/fatigue and weight loss.  Eyes:  Negative for blurred vision and double vision.  Respiratory:  Negative for cough and shortness of breath.   Cardiovascular:  Positive for leg swelling. Negative for chest pain and palpitations.  Gastrointestinal:  Negative for abdominal pain, constipation, diarrhea, nausea and vomiting.  Genitourinary:  Negative for dysuria, frequency and urgency.  Neurological:  Negative for dizziness and focal weakness.        Objective    BP 132/86   Pulse 82   Temp 97.7 F (36.5 C) (Temporal)   Ht 5' 10 (1.778 m)   Wt (!) 389 lb (176.4 kg)   SpO2 97%   BMI 55.82 kg/m   Physical Exam Constitutional:      General: He is not in acute distress.    Appearance: He is obese. He is not ill-appearing.  HENT:     Mouth/Throat:     Mouth: Mucous membranes are moist.  Eyes:     Extraocular Movements: Extraocular movements intact.     Conjunctiva/sclera: Conjunctivae normal.  Cardiovascular:     Rate and Rhythm: Normal rate and regular rhythm.  Pulmonary:     Effort: Pulmonary effort is normal.     Breath sounds: Normal breath sounds.  Musculoskeletal:     Cervical back: Normal range of motion and neck supple.     Right lower leg: Edema  present.     Left lower leg: Edema present.  Skin:    General: Skin is warm and dry.  Neurological:     General: No focal deficit present.     Mental Status: He is alert and oriented to person, place, and time.     Gait: Gait abnormal.  Psychiatric:        Mood and Affect: Mood normal.        Behavior: Behavior normal.        Thought Content: Thought content normal.         Assessment & Plan:   Problem List Items Addressed This Visit     Essential hypertension, benign (Chronic)  Obesity, morbid, BMI 50 or higher (HCC) (Chronic)   Cirrhosis of liver without ascites (HCC) - Primary   Relevant Orders   CBC with Differential/Platelet   Hepatic function panel   Ambulatory referral to Gastroenterology   OSA (obstructive sleep apnea)   Other Visit Diagnoses       Left lower lobe pulmonary infiltrate       Relevant Orders   CBC with Differential/Platelet     Hepatic steatosis       Relevant Orders   Ambulatory referral to Gastroenterology     Abnormal liver function tests       Relevant Orders   Hepatic function panel   Ambulatory referral to Gastroenterology     Calculus of gallbladder without cholecystitis without obstruction         Prediabetes       Relevant Orders   POCT HgB A1C (Completed)     Thrombocytopenia (HCC)       Relevant Orders   Ambulatory referral to Gastroenterology     Diverticulosis       Relevant Orders   Ambulatory referral to Gastroenterology     Bilateral leg edema           Assessment and Plan Assessment & Plan Establishing care New patient with a history of recent left heart catheterization, hypertension, hyperlipidemia, obstructive sleep apnea, prediabetes, pneumonia, lower extremity edema, hyponatremia, and thrombocytopenia. No immediate concerns. Lives alone with support from a nearby sister.  Cirrhosis of liver with elevated liver enzymes and daily alcohol use Cirrhosis with elevated liver enzymes, likely exacerbated by daily  alcohol consumption. - Advise cessation of alcohol use - Refer to liver specialist to assess cirrhosis severity  Hypertension Hypertension managed with hydrochlorothiazide . Current reading is 132/86 mmHg.  Hyperlipidemia Recently started on medication for hyperlipidemia, tolerating well.  Obstructive sleep apnea Obstructive sleep apnea with previous CPAP use, currently not using due to device difficulty. -pulmonology to address CPAP issues  Prediabetes Prediabetes with recent blood sugar of 226 mg/dL. - Check A1c to assess glycemic control -PCOT A1c 6.2%  Pneumonia, unspecified organism Recent pneumonia diagnosis, on two antibiotics. Chest X-ray showed left lung base pulmonary vascular congestion, suspicious for pneumonia. COVID and flu tests negative.  Lower extremity edema Lower extremity edema possibly related to liver cirrhosis. - echocardiogram per cardiology to assess heart function due to recent chest pain and swelling  Hyponatremia Recent labs showed low sodium levels. - Recheck labs for sodium and electrolytes  Thrombocytopenia Thrombocytopenia with current labs lacking platelet count. - Order CBC to check platelet count  Follow-Up - Refer to [facility] for liver evaluation - Ordered blood work including liver function, CBC, and electrolytes     Return in about 6 weeks (around 06/15/2024).   Boby Mackintosh, NP-C

## 2024-05-05 ENCOUNTER — Ambulatory Visit: Payer: Self-pay | Admitting: Family Medicine

## 2024-05-05 LAB — CBC WITH DIFFERENTIAL/PLATELET
Basophils Absolute: 0.1 K/uL (ref 0.0–0.1)
Basophils Relative: 1.2 % (ref 0.0–3.0)
Eosinophils Absolute: 0.3 K/uL (ref 0.0–0.7)
Eosinophils Relative: 3.5 % (ref 0.0–5.0)
HCT: 44.8 % (ref 39.0–52.0)
Hemoglobin: 15.1 g/dL (ref 13.0–17.0)
Lymphocytes Relative: 29.5 % (ref 12.0–46.0)
Lymphs Abs: 2.3 K/uL (ref 0.7–4.0)
MCHC: 33.8 g/dL (ref 30.0–36.0)
MCV: 95.5 fl (ref 78.0–100.0)
Monocytes Absolute: 0.5 K/uL (ref 0.1–1.0)
Monocytes Relative: 7.1 % (ref 3.0–12.0)
Neutro Abs: 4.5 K/uL (ref 1.4–7.7)
Neutrophils Relative %: 58.7 % (ref 43.0–77.0)
Platelets: 172 K/uL (ref 150.0–400.0)
RBC: 4.69 Mil/uL (ref 4.22–5.81)
RDW: 14.6 % (ref 11.5–15.5)
WBC: 7.7 K/uL (ref 4.0–10.5)

## 2024-05-05 LAB — HEPATIC FUNCTION PANEL
ALT: 42 U/L (ref 0–53)
AST: 31 U/L (ref 0–37)
Albumin: 3.7 g/dL (ref 3.5–5.2)
Alkaline Phosphatase: 48 U/L (ref 39–117)
Bilirubin, Direct: 0.1 mg/dL (ref 0.0–0.3)
Total Bilirubin: 0.4 mg/dL (ref 0.2–1.2)
Total Protein: 7.6 g/dL (ref 6.0–8.3)

## 2024-05-06 LAB — LIPID PANEL
Chol/HDL Ratio: 3.9 ratio (ref 0.0–5.0)
Cholesterol, Total: 153 mg/dL (ref 100–199)
HDL: 39 mg/dL — ABNORMAL LOW (ref >39)
LDL Chol Calc (NIH): 97 mg/dL (ref 0–99)
Triglycerides: 89 mg/dL (ref 0–149)
VLDL Cholesterol Cal: 17 mg/dL (ref 5–40)

## 2024-05-06 LAB — BASIC METABOLIC PANEL WITH GFR
BUN/Creatinine Ratio: 23 — ABNORMAL HIGH (ref 9–20)
BUN: 17 mg/dL (ref 6–24)
CO2: 21 mmol/L (ref 20–29)
Calcium: 9.2 mg/dL (ref 8.7–10.2)
Chloride: 100 mmol/L (ref 96–106)
Creatinine, Ser: 0.75 mg/dL — ABNORMAL LOW (ref 0.76–1.27)
Glucose: 175 mg/dL — ABNORMAL HIGH (ref 70–99)
Potassium: 4.6 mmol/L (ref 3.5–5.2)
Sodium: 140 mmol/L (ref 134–144)
eGFR: 108 mL/min/1.73 (ref >59)

## 2024-05-08 ENCOUNTER — Telehealth: Payer: Self-pay | Admitting: Cardiovascular Disease

## 2024-05-08 NOTE — Telephone Encounter (Signed)
 Received the disability form from [employer].  I called and left a message for the patient to call me back regarding our form procedures.

## 2024-05-08 NOTE — Telephone Encounter (Signed)
 Pt and wife called in and stated that his work is faxing over paperwork to be filled out for disability.  They were give the fax number today.  Just an RICK that this is on the way    Best number [phone]

## 2024-05-08 NOTE — Telephone Encounter (Signed)
 What kind of disability paperwork? I am off this week, but I can type up a work note to give Ernest Myers 1 week off of work due to the cardiac catheterization, but his cath showed his chest pain is not originating from his heart. He may work without restriction at this point from cardiac perspective.

## 2024-05-09 DIAGNOSIS — Z0279 Encounter for issue of other medical certificate: Secondary | ICD-10-CM

## 2024-05-09 NOTE — Telephone Encounter (Signed)
 Patient signed release of information and paid the $29 form fee.  The [employer] Term Disability form in in [place] box.

## 2024-05-12 NOTE — Telephone Encounter (Signed)
 I have filled out his short-term disability paperwork and released him back to work starting next Monday. I will leave his paperwork in my in-box. Grettel Rames

## 2024-05-17 NOTE — Telephone Encounter (Signed)
 Disability form faxed to [facility] and scanned into chart.  Billing notified.

## 2024-05-26 ENCOUNTER — Encounter: Payer: Self-pay | Admitting: Physician Assistant

## 2024-05-26 ENCOUNTER — Ambulatory Visit: Attending: Physician Assistant | Admitting: Physician Assistant

## 2024-05-26 ENCOUNTER — Telehealth: Payer: Self-pay | Admitting: Physician Assistant

## 2024-05-26 VITALS — BP 150/96 | HR 83 | Ht 70.0 in | Wt >= 6400 oz

## 2024-05-26 DIAGNOSIS — G4733 Obstructive sleep apnea (adult) (pediatric): Secondary | ICD-10-CM

## 2024-05-26 DIAGNOSIS — K7460 Unspecified cirrhosis of liver: Secondary | ICD-10-CM

## 2024-05-26 DIAGNOSIS — R0789 Other chest pain: Secondary | ICD-10-CM

## 2024-05-26 DIAGNOSIS — I10 Essential (primary) hypertension: Secondary | ICD-10-CM

## 2024-05-26 MED ORDER — AMLODIPINE BESYLATE 2.5 MG PO TABS: 2.5000 mg | ORAL_TABLET | Freq: Every day | ORAL | 3 refills | Status: AC

## 2024-05-26 NOTE — Patient Instructions (Addendum)
 Thank you for choosing [facility]!     Medication Instructions:  START Amlodipine  2.5 mg. Take one tablet daily.  STOP THE ASPIRIN  81mg .   *If you need a refill on your cardiac medications before your next appointment, please call your pharmacy*   Lab Work: No labs were ordered during today's visit.  If you have labs (blood work) drawn today and your tests are completely normal, you will receive your results only by: MyChart Message (if you have MyChart) OR A paper copy in the mail If you have any lab test that is abnormal or we need to change your treatment, we will call you to review the results.   Testing/Procedures: Your echo for 05/31/24 has been canceled  Your next appointment:  as needed for cardiac concerns     Provider:   Scot Ford, PA-C           Follow-Up: At [facility], you and your health needs are our priority.  As part of our continuing mission to provide you with exceptional heart care, we have created designated Provider Care Teams.  These Care Teams include your primary Cardiologist (physician) and Advanced Practice Providers (APPs -  Physician Assistants and Nurse Practitioners) who all work together to provide you with the care you need, when you need it. We recommend signing up for the patient portal called MyChart.  Sign up information is provided on this After Visit Summary.  MyChart is used to connect with patients for Virtual Visits (Telemedicine).  Patients are able to view lab/test results, encounter notes, upcoming appointments, etc.  Non-urgent messages can be sent to your provider as well.   To learn more about what you can do with MyChart, go to [email or web address].

## 2024-05-26 NOTE — Telephone Encounter (Signed)
 FMLA payment posted to pt's account.  JB, 05-26-24

## 2024-05-26 NOTE — Progress Notes (Unsigned)
 Cardiology Office Note   Date:  05/28/2024  ID:  Ernest Myers, DOB [birth year], MRN [id number] PCP: Ernest Boby CROME, NP-C  [facility] Providers Cardiologist:  [facility] - DOD Dr. Verlin   History of Present Illness Ernest Myers is a 53 y.o. male with past medical history of fatty liver disease with cirrhosis, hypertension, hyperlipidemia and DM2.  Patient has been to the ED 3 times since 02/22/2024 for evaluation of chest pain and leg swelling.  Symptom started in May.  Renal function electrolyte was normal.  BNP 68.5.  CBC was 15.9.  Serial troponin negative x 2.  Chest x-ray showed enlargement of cardio pericardial silhouette with pulmonary vascular congestion.  CTA of the chest was negative for PE but does show coronary artery calcification.  Lower extremity venous Doppler was negative for DVT bilaterally he was referred to cardiology service as outpatient.  Since then, he has returned back to the ED on 04/04/2024 for chest pain radiating to the left neck and jaw.  Workup including troponin, chest x-ray were normal.  CBC showed thrombocytopenia with platelet of 86.  Renal function and electrolytes were normal.  Patient was treated with Toradol .  EKG showed no acute ischemic changes.  I last saw the [facility] on 04/21/2024 for evaluation of chest discomfort.  He was close to 400 pounds, therefore was a poor candidate for noninvasive study.  I discussed his case with DOD Dr. Verlin and we recommended definitive evaluation via cardiac catheterization.  We also referred the patient to pulmonology service to help manage his obstructive sleep apnea.  He has not used CPAP machine in several years.  I started him on 81 mg aspirin  and a 25 mg daily of HCTZ for his elevated blood pressure.  He had a low platelet at that time, however platelet has since normalized.  He underwent outpatient cardiac catheterization on 05/02/2024 that showed widely patent coronary arteries,  right dominant system, EF greater than 65%.  Recent renal stone CT obtained on 04/25/2024 showed hepatic steatosis and cirrhosis, cholelithiasis, 17 x 11 mm subsolid density seen in the right lower lobe of the liver.  Recommend repeat imaging in 3 months.  Patient presents today for follow-up.  He continues to have intermittent chest discomfort, symptom of is quite atypical.  I reassured the patient that his chest pain is unlikely to be related to the heart.  For the cardiac perspective, patient may return to work next Monday without further restriction.  His blood pressure remained elevated around 150/96, I will add amlodipine  2.5 mg daily to his medical regimen.  He has upcoming follow-up with sleep specialist, once his obstructive sleep apnea is under good control, I suspect he is able to come off of amlodipine  in the future.  I will remove his aspirin .  He no longer needs echocardiogram that is scheduled for next week since his LV gram is greater than 65%.  He does not have any signs or symptom of heart failure on physical exam.  He can follow-up with cardiology service on a as needed basis.    ROS:   He denies chest pain, palpitations, dyspnea, pnd, orthopnea, n, v, dizziness, syncope, edema, weight gain, or early satiety. All other systems reviewed and are otherwise negative except as noted above.    Studies Reviewed      Cardiac Studies & Procedures   ______________________________________________________________________________________________ CARDIAC CATHETERIZATION  CARDIAC CATHETERIZATION 05/02/2024  Conclusion   The left ventricular systolic function  is normal.   LV end diastolic pressure is normal.   The left ventricular ejection fraction is greater than 65% by visual estimate.  1.  Widely patent coronary arteries (right dominant) with mild nonobstructive plaquing of the RCA and LAD 2.  Normal LV function with LVEF estimated greater than 65% and normal LVEDP  Suspect noncardiac  chest pain.  Findings Coronary Findings Diagnostic  Dominance: Right  Left Anterior Descending There is mild diffuse disease throughout the vessel. The LAD has mild diffuse plaquing but no significant stenoses.  Less than 30% stenosis of the mid vessel was noted.  Diagonal branches are patent.  Left Circumflex The vessel exhibits minimal luminal irregularities. The circumflex and OM branches are patent without significant disease.  Right Coronary Artery Vessel is large. There is mild diffuse disease throughout the vessel. Large, dominant vessel with mild 20 to 30% stenosis in the mid and distal vessel.  No significant lesions identified.  Intervention  No interventions have been documented.              ______________________________________________________________________________________________      Risk Assessment/Calculations          Physical Exam VS:  BP (!) 150/96   Pulse 83   Ht 5' 10 (1.778 m)   Wt (!) 401 lb (181.9 kg)   SpO2 95%   BMI 57.54 kg/m        Wt Readings from Last 3 Encounters:  05/26/24 (!) 401 lb (181.9 kg)  05/04/24 (!) 389 lb (176.4 kg)  05/02/24 (!) 390 lb (176.9 kg)    GEN: Well nourished, well developed in no acute distress NECK: No JVD; No carotid bruits CARDIAC: RRR, no murmurs, rubs, gallops RESPIRATORY:  Clear to auscultation without rales, wheezing or rhonchi  ABDOMEN: Soft, non-tender, non-distended EXTREMITIES:  No edema; No deformity   ASSESSMENT AND PLAN  Atypical chest pain: Recent cardiac catheterization showed no significant coronary artery disease.  He has upcoming echocardiogram, LV gram during cardiac catheterization was normal, therefore he no longer needed echocardiogram.  Will cancel echo  Hypertension: Blood pressure elevated, add amlodipine  2.5 mg daily  OSA: Follow-up with the sleep specialist.  Cirrhosis: Seen on recent CT image. There was also a 17 x 11 mm subsolid density seen in the right lower lobe  of the liver. Follow-up with PCP.          Dispo: Follow-up with cardiology service on a as needed basis  Signed, Scot Ford, [place]

## 2024-05-28 ENCOUNTER — Encounter: Payer: Self-pay | Admitting: Physician Assistant

## 2024-05-31 ENCOUNTER — Ambulatory Visit (HOSPITAL_COMMUNITY)

## 2024-06-07 ENCOUNTER — Telehealth: Payer: Self-pay | Admitting: Family Medicine

## 2024-06-07 NOTE — Telephone Encounter (Signed)
 Paperwork received, pt will need an appt as we were not aware he was out of work. Called and scheduled visit for form completion

## 2024-06-07 NOTE — Telephone Encounter (Signed)
 Pt sister brought over paperwork that needs to be filled out and faxed over to its designated company. Pt sister would like a call once request is completed. Paperwork is in providers box. Okay to shred after.. Please advise, Thanks

## 2024-06-15 ENCOUNTER — Encounter: Payer: Self-pay | Admitting: Family Medicine

## 2024-06-15 ENCOUNTER — Ambulatory Visit (INDEPENDENT_AMBULATORY_CARE_PROVIDER_SITE_OTHER): Admitting: Family Medicine

## 2024-06-15 VITALS — BP 150/90 | HR 81 | Temp 97.4°F | Ht 70.0 in | Wt 394.8 lb

## 2024-06-15 DIAGNOSIS — G4733 Obstructive sleep apnea (adult) (pediatric): Secondary | ICD-10-CM

## 2024-06-15 DIAGNOSIS — I10 Essential (primary) hypertension: Secondary | ICD-10-CM

## 2024-06-15 DIAGNOSIS — M545 Low back pain, unspecified: Secondary | ICD-10-CM | POA: Diagnosis not present

## 2024-06-15 DIAGNOSIS — R7303 Prediabetes: Secondary | ICD-10-CM | POA: Diagnosis not present

## 2024-06-15 NOTE — Progress Notes (Signed)
 Subjective:     Patient ID: Ernest Myers, male    DOB: [birth year], 53 y.o.   MRN: [id number]  Chief Complaint  Patient presents with   Follow-up    FMLA-papers to be released back to work    HPI  Discussed the use of AI scribe software for clinical note transcription with the patient, who gave verbal consent to proceed.  History of Present Illness Ernest Myers is a 53 year old male with hypertension and cirrhosis who presents for follow-up on chronic health conditions.  Right-sided back pain - Severe at times - Localized to the right side of the back - Non-radiating - Worsens with standing and movement - No medication taken for pain - No history of injury - Subcutaneous nodules present in the area of pain - No weakness or falls  Hypertension - Managed with low dose amlodipine  and a diuretic - Medications taken today - Cardiologist is working to lower blood pressure  Obstructive sleep apnea - Uses CPAP machine - Pulmonology follow-up scheduled in a couple of weeks  Functional status - Out of work recently. States no provider took him out of work.  - Has returned to work and is feeling better     Health Maintenance Due  Topic Date Due   Diabetic kidney evaluation - Urine ACR  Never done   DTaP/Tdap/Td (1 - Tdap) Never done   Pneumococcal Vaccine: 50+ Years (1 of 2 - PCV) Never done   Hepatitis B Vaccines 19-59 Average Risk (1 of 3 - 19+ 3-dose series) Never done   Colonoscopy  Never done   Zoster Vaccines- Shingrix (1 of 2) Never done    Past Medical History:  Diagnosis Date   Arthritis    Diabetes mellitus without complication (HCC)    Fatty liver    Hypertension     Past Surgical History:  Procedure Laterality Date   LEFT HEART CATH AND CORONARY ANGIOGRAPHY N/A 05/02/2024   Procedure: LEFT HEART CATH AND CORONARY ANGIOGRAPHY;  Surgeon: Wonda Sharper, MD;  Location: [facility] INVASIVE CV LAB;  Service: Cardiovascular;  Laterality: N/A;    Family  History  Problem Relation Age of Onset   Diabetes Mother    Heart disease Father     Social History   Socioeconomic History   Marital status: Single    Spouse name: Not on file   Number of children: Not on file   Years of education: Not on file   Highest education level: 12th grade  Occupational History   Not on file  Tobacco Use   Smoking status: Never   Smokeless tobacco: Never  Vaping Use   Vaping status: Never Used  Substance and Sexual Activity   Alcohol use: Yes    Comment: social- 2-3 days/wk   Drug use: No   Sexual activity: Not on file  Other Topics Concern   Not on file  Social History Narrative   Not on file   Social Drivers of Health   Financial Resource Strain: Medium Risk (05/03/2024)   Overall Financial Resource Strain (CARDIA)    Difficulty of Paying Living Expenses: Somewhat hard  Food Insecurity: Food Insecurity Present (05/03/2024)   Hunger Vital Sign    Worried About Running Out of Food in the Last Year: Sometimes true    Ran Out of Food in the Last Year: Sometimes true  Transportation Needs: No Transportation Needs (05/03/2024)   PRAPARE - Administrator, Civil Service (Medical): No  Lack of Transportation (Non-Medical): No  Physical Activity: Insufficiently Active (05/03/2024)   Exercise Vital Sign    Days of Exercise per Week: 4 days    Minutes of Exercise per Session: 20 min  Stress: No Stress Concern Present (05/03/2024)   [employer] of Occupational Health - Occupational Stress Questionnaire    Feeling of Stress: Only a little  Social Connections: Moderately Isolated (05/03/2024)   Social Connection and Isolation Panel    Frequency of Communication with Friends and Family: More than three times a week    Frequency of Social Gatherings with Friends and Family: More than three times a week    Attends Religious Services: 1 to 4 times per year    Active Member of [employer] or Organizations: No    Attends Hospital doctor: Not on file    Marital Status: Divorced  Intimate Partner Violence: Not on file    Outpatient Medications Prior to Visit  Medication Sig Dispense Refill   amLODipine  (NORVASC ) 2.5 MG tablet Take 1 tablet (2.5 mg total) by mouth daily. 60 tablet 3   hydrochlorothiazide  (HYDRODIURIL ) 25 MG tablet Take 1 tablet (25 mg total) by mouth daily. 90 tablet 3   No facility-administered medications prior to visit.    No Known Allergies  Review of Systems  Constitutional:  Negative for chills, fever and malaise/fatigue.  Respiratory:  Negative for shortness of breath.   Cardiovascular:  Negative for chest pain, palpitations and leg swelling.  Gastrointestinal:  Negative for abdominal pain, constipation, diarrhea, nausea and vomiting.  Genitourinary:  Negative for dysuria, frequency and urgency.  Musculoskeletal:  Positive for back pain. Negative for falls.  Neurological:  Negative for dizziness, focal weakness and headaches.  Psychiatric/Behavioral:  Negative for depression. The patient is not nervous/anxious.        Objective:    Physical Exam Constitutional:      General: He is not in acute distress.    Appearance: He is obese. He is not ill-appearing.  HENT:     Mouth/Throat:     Mouth: Mucous membranes are moist.     Pharynx: Oropharynx is clear.  Eyes:     Extraocular Movements: Extraocular movements intact.     Conjunctiva/sclera: Conjunctivae normal.     Pupils: Pupils are equal, round, and reactive to light.  Cardiovascular:     Rate and Rhythm: Normal rate and regular rhythm.  Pulmonary:     Effort: Pulmonary effort is normal.     Breath sounds: Normal breath sounds.  Musculoskeletal:     Cervical back: Normal, normal range of motion and neck supple.     Thoracic back: Normal.     Lumbar back: Tenderness present. Normal range of motion. Negative right straight leg raise test and negative left straight leg raise test.     Right lower leg: No edema.     Left  lower leg: No edema.     Comments: Right sided lumbar paraspinal muscle TTP  Skin:    General: Skin is warm and dry.  Neurological:     General: No focal deficit present.     Mental Status: He is alert and oriented to person, place, and time.  Psychiatric:        Mood and Affect: Mood normal.        Behavior: Behavior normal.        Thought Content: Thought content normal.      BP (!) 150/90 (BP Location: Left Arm)   Pulse 81  Temp (!) 97.4 F (36.3 C) (Temporal)   Ht 5' 10 (1.778 m)   Wt (!) 394 lb 12.8 oz (179.1 kg)   SpO2 96%   BMI 56.65 kg/m  Wt Readings from Last 3 Encounters:  06/15/24 (!) 394 lb 12.8 oz (179.1 kg)  05/26/24 (!) 401 lb (181.9 kg)  05/04/24 (!) 389 lb (176.4 kg)       Assessment & Plan:   Problem List Items Addressed This Visit     Low back pain - Primary (Chronic)   Obesity, morbid, BMI 50 or higher (HCC) (Chronic)   OSA (obstructive sleep apnea)   Prediabetes   Primary hypertension    Assessment and Plan Assessment & Plan Right-sided back pain with subcutaneous nodules New complaint of localized right-sided back pain with subcutaneous nodules, likely benign. No history of injury or weakness. - Consider topical treatments such as Salonpas patches or rubs - Consider referral to sports medicine or physical therapy if pain persists - Consider x-ray if symptoms persist  Essential hypertension Blood pressure remains elevated despite current low-dose amlodipine  and hydrochlorothiazide  regimen. Cardiologist is managing the condition. - Recheck blood pressure during the visit - Consider increasing amlodipine  dose if blood pressure remains elevated  Bilateral leg edema  Cirrhosis of liver Requires follow-up with gastroenterology for liver function assessment. No recent appointment made. - Provide phone number for [facility] to schedule an appointment for liver function assessment     I am having Ernest Myers maintain his  hydrochlorothiazide  and amLODipine .  No orders of the defined types were placed in this encounter.

## 2024-06-15 NOTE — Patient Instructions (Addendum)
 Please call [facility] to schedule a visit  [phone].   Please monitor your blood pressure at home and if your readings are consistently higher than 130/80, let me or your heart doctor know about this.       Back Exercises These exercises help to make your trunk and back strong. They also help to keep the lower back flexible. Doing these exercises can help to prevent or lessen pain in your lower back. If you have back pain, try to do these exercises 2-3 times each day or as told by your doctor. As you get better, do the exercises once each day. Repeat the exercises more often as told by your doctor. To stop back pain from coming back, do the exercises once each day, or as told by your doctor. Do exercises exactly as told by your doctor. Stop right away if you feel sudden pain or your pain gets worse. Exercises Single knee to chest Do these steps 3-5 times in a row for each leg: Lie on your back on a firm bed or the floor with your legs stretched out. Bring one knee to your chest. Grab your knee or thigh with both hands and hold it in place. Pull on your knee until you feel a gentle stretch in your lower back or butt. Keep doing the stretch for 10-30 seconds. Slowly let go of your leg and straighten it. Pelvic tilt Do these steps 5-10 times in a row: Lie on your back on a firm bed or the floor with your legs stretched out. Bend your knees so they point up to the ceiling. Your feet should be flat on the floor. Tighten your lower belly (abdomen) muscles to press your lower back against the floor. This will make your tailbone point up to the ceiling instead of pointing down to your feet or the floor. Stay in this position for 5-10 seconds while you gently tighten your muscles and breathe evenly. Cat-cow Do these steps until your lower back bends more easily: Get on your hands and knees on a firm bed or the floor. Keep your hands under your shoulders, and keep your  knees under your hips. You may put padding under your knees. Let your head hang down toward your chest. Tighten (contract) the muscles in your belly. Point your tailbone toward the floor so your lower back becomes rounded like the back of a cat. Stay in this position for 5 seconds. Slowly lift your head. Let the muscles of your belly relax. Point your tailbone up toward the ceiling so your back forms a sagging arch like the back of a cow. Stay in this position for 5 seconds.  Press-ups Do these steps 5-10 times in a row: Lie on your belly (face-down) on a firm bed or the floor. Place your hands near your head, about shoulder-width apart. While you keep your back relaxed and keep your hips on the floor, slowly straighten your arms to raise the top half of your body and lift your shoulders. Do not use your back muscles. You may change where you place your hands to make yourself more comfortable. Stay in this position for 5 seconds. Keep your back relaxed. Slowly return to lying flat on the floor.  Bridges Do these steps 10 times in a row: Lie on your back on a firm bed or the floor. Bend your knees so they point up to the ceiling. Your feet should be flat on the floor. Your arms should be  flat at your sides, next to your body. Tighten your butt muscles and lift your butt off the floor until your waist is almost as high as your knees. If you do not feel the muscles working in your butt and the back of your thighs, slide your feet 1-2 inches (2.5-5 cm) farther away from your butt. Stay in this position for 3-5 seconds. Slowly lower your butt to the floor, and let your butt muscles relax. If this exercise is too easy, try doing it with your arms crossed over your chest. Belly crunches Do these steps 5-10 times in a row: Lie on your back on a firm bed or the floor with your legs stretched out. Bend your knees so they point up to the ceiling. Your feet should be flat on the floor. Cross your arms  over your chest. Tip your chin a little bit toward your chest, but do not bend your neck. Tighten your belly muscles and slowly raise your chest just enough to lift your shoulder blades a tiny bit off the floor. Avoid raising your body higher than that because it can put too much stress on your lower back. Slowly lower your chest and your head to the floor. Back lifts Do these steps 5-10 times in a row: Lie on your belly (face-down) with your arms at your sides, and rest your forehead on the floor. Tighten the muscles in your legs and your butt. Slowly lift your chest off the floor while you keep your hips on the floor. Keep the back of your head in line with the curve in your back. Look at the floor while you do this. Stay in this position for 3-5 seconds. Slowly lower your chest and your face to the floor. Contact a doctor if: Your back pain gets a lot worse when you do an exercise. Your back pain does not get better within 2 hours after you exercise. If you have any of these problems, stop doing the exercises. Do not do them again unless your doctor says it is okay. Get help right away if: You have sudden, very bad back pain. If this happens, stop doing the exercises. Do not do them again unless your doctor says it is okay. This information is not intended to replace advice given to you by your health care provider. Make sure you discuss any questions you have with your health care provider. Document Revised: 11/13/2020 Document Reviewed: 11/13/2020 Elsevier Patient Education  2024 [employer].

## 2024-06-21 NOTE — Progress Notes (Signed)
 06/22/24- 53 yoM for sleep evaluation courtesy of  [place], [place] with hx of OSA NPSG 05/11/04- ( in Epic Notes as Orthopedic posted5/15/12, but actually done 05/11/04) AHI 119/hr, desat to 71%, CPAP to 15. Medica; problems include- HTN, GERD, Hepatic Cirrhosis, Depression, Obesity. Epworth score-9 Body weight today-399 lbs. Discussed the use of AI scribe software for clinical note transcription with the patient, who gave verbal consent to proceed.  History of Present Illness   Ernest Myers is a 53 year old male who presents with concerns about sleep apnea.  He was diagnosed with sleep apnea in 2005 and previously used CPAP therapy, which he misplaced. He currently experiences snoring and episodes of apnea during sleep, with excessive daytime sleepiness. He does not use sleep medications but occasionally consumes caffeine or energy drinks.  A chest x-ray in August 2025 showed a shadow at the bottom of the left lung and fluid overload, treated with antibiotics. He feels that problem resolved. He denies any nasal or throat surgeries and has no known lung  diseases aside from past concerns.  He indicated cardiac cath demonstrated non-obstructive coronary disease.  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   Prior to Admission medications   Medication Sig Start Date End Date Taking? Authorizing Provider  amLODipine  (NORVASC ) 2.5 MG tablet Take 1 tablet (2.5 mg total) by mouth daily. 05/26/24 08/24/24 Yes Meng, Hao, PA  hydrochlorothiazide  (HYDRODIURIL ) 25 MG tablet Take 1 tablet (25 mg total) by mouth daily. 04/21/24 07/20/24 Yes Meng, Hao, PA   Past Medical History:  Diagnosis Date   Arthritis    Diabetes mellitus without complication (HCC)    Fatty liver    Hypertension    Past Surgical History:  Procedure Laterality Date   LEFT HEART CATH AND CORONARY ANGIOGRAPHY N/A 05/02/2024   Procedure: LEFT HEART CATH AND CORONARY  ANGIOGRAPHY;  Surgeon: Wonda Sharper, MD;  Location: [facility] INVASIVE CV LAB;  Service: Cardiovascular;  Laterality: N/A;   Family History  Problem Relation Age of Onset   Diabetes Mother    Heart disease Father    Social History   Socioeconomic History   Marital status: Single    Spouse name: Not on file   Number of children: Not on file   Years of education: Not on file   Highest education level: 12th grade  Occupational History   Not on file  Tobacco Use   Smoking status: Never   Smokeless tobacco: Never  Vaping Use   Vaping status: Never Used  Substance and Sexual Activity   Alcohol use: Yes    Comment: social- 2-3 days/wk   Drug use: No   Sexual activity: Not on file  Other Topics Concern   Not on file  Social History Narrative   Not on file   Social Drivers of Health   Financial Resource Strain: Medium Risk (05/03/2024)   Overall Financial Resource Strain (CARDIA)    Difficulty of Paying Living Expenses: Somewhat hard  Food Insecurity: Food Insecurity Present (05/03/2024)   Hunger Vital Sign    Worried About Running Out of Food in the Last Year: Sometimes true    Ran Out of Food in the Last Year: Sometimes true  Transportation Needs: No Transportation Needs (05/03/2024)   PRAPARE - Administrator, Civil Service (Medical): No    Lack of Transportation (Non-Medical): No  Physical Activity: Insufficiently Active (05/03/2024)   Exercise Vital Sign  Days of Exercise per Week: 4 days    Minutes of Exercise per Session: 20 min  Stress: No Stress Concern Present (05/03/2024)   [employer] of Occupational Health - Occupational Stress Questionnaire    Feeling of Stress: Only a little  Social Connections: Moderately Isolated (05/03/2024)   Social Connection and Isolation Panel    Frequency of Communication with Friends and Family: More than three times a week    Frequency of Social Gatherings with Friends and Family: More than three times a week    Attends  Religious Services: 1 to 4 times per year    Active Member of [employer] or Organizations: No    Attends Engineer, structural: Not on file    Marital Status: Divorced  Catering manager Violence: Not on file   Assessment and Plan:    Obstructive sleep apnea Obstructive sleep apnea likely persists with snoring, daytime sleepiness, and apneic episodes. Untreated, it increases risks of heart disease, stroke, and arrhythmias. Thick tongue base contributes to airway obstruction. CPAP therapy is most effective; alternatives less suitable due to weight and symptom severity. - Order home sleep test to confirm diagnosis and severity. - Instruct him to call the office two weeks after the sleep test for results. - If confirmed, initiate CPAP therapy. - Educated on CPAP importance and technology improvements. - Advise to contact office if no communication regarding test results.   Morbid Obesityy-effort encouraged       ROS-see HPI   + = positive Constitutional:    weight loss, night sweats, fevers, chills, fatigue, lassitude. HEENT:    headaches, difficulty swallowing, tooth/dental problems, sore throat,       sneezing, itching, ear ache, nasal congestion, post nasal drip, snoring CV:   + chest pain, orthopnea, PND, +swelling in lower extremities, anasarca,                                   dizziness, palpitations Resp:   +shortness of breath with exertion or at rest.                productive cough,   non-productive cough, coughing up of blood.              change in color of mucus.  wheezing.   Skin:    rash or lesions. GI:  + heartburn, indigestion, abdominal pain, nausea, vomiting, diarrhea,                 change in bowel habits, loss of appetite GU: dysuria, change in color of urine, no urgency or frequency.   flank pain. MS:   joint pain, +stiffness, decreased range of motion, back pain. Neuro-     nothing unusual Psych:  change in mood or affect.  depression or anxiety.   memory  loss.  OBJ- Physical Exam General- Alert, Oriented, Affect-appropriate, Distress- none acute, +obese Skin- rash-none, lesions- none, excoriation- none Lymphadenopathy- none Head- atraumatic            Eyes- Gross vision intact, PERRLA, conjunctivae and secretions clear            Ears- Hearing, canals-normal            Nose- Clear, no-Septal dev, mucus, polyps, erosion, perforation             Throat- Mallampati IV , mucosa clear , drainage- none, tonsils- atrophic, +teeth Neck- flexible , trachea midline, no  stridor , thyroid nl, carotid no bruit Chest - symmetrical excursion , unlabored           Heart/CV- RRR , no murmur , no gallop  , no rub, nl s1 s2                           - JVD- none , edema- none, stasis changes- none, varices- none           Lung- clear to P&A, wheeze- none, cough- none , dullness-none, rub- none           Chest wall-  Abd-  Br/ Gen/ Rectal- Not done, not indicated Extrem- cyanosis- none, clubbing, none, atrophy- none, strength- nl Neuro- grossly intact to observation

## 2024-06-22 ENCOUNTER — Encounter: Payer: Self-pay | Admitting: Internal Medicine

## 2024-06-22 ENCOUNTER — Ambulatory Visit (INDEPENDENT_AMBULATORY_CARE_PROVIDER_SITE_OTHER): Admitting: Internal Medicine

## 2024-06-22 VITALS — BP 148/86 | HR 82 | Temp 98.0°F | Ht 70.0 in | Wt 399.2 lb

## 2024-06-22 DIAGNOSIS — G4733 Obstructive sleep apnea (adult) (pediatric): Secondary | ICD-10-CM | POA: Diagnosis not present

## 2024-06-22 NOTE — Patient Instructions (Signed)
 Order- schedule home sleep test      dx OSA  Please call us  about 2 weeks after your sleep test for results and recommendations. We will probbly get you set up with a new CPAP.

## 2024-08-01 ENCOUNTER — Encounter: Payer: Self-pay | Admitting: Family Medicine

## 2024-08-03 ENCOUNTER — Encounter: Payer: Self-pay | Admitting: Internal Medicine
